# Patient Record
Sex: Female | Born: 1985 | Race: Black or African American | Hispanic: No | Smoking: Never smoker
Health system: Southern US, Community
[De-identification: ages and names within clinical notes are randomized; demographics above are authoritative.]

## PROBLEM LIST (undated history)

## (undated) DIAGNOSIS — F419 Anxiety disorder, unspecified: Secondary | ICD-10-CM

## (undated) DIAGNOSIS — D649 Anemia, unspecified: Secondary | ICD-10-CM

## (undated) HISTORY — DX: Anemia, unspecified: D64.9

---

## 2007-02-05 ENCOUNTER — Encounter: Admission: RE | Admit: 2007-02-05 | Discharge: 2007-02-05 | Payer: Self-pay | Admitting: *Deleted

## 2007-02-27 ENCOUNTER — Inpatient Hospital Stay (HOSPITAL_COMMUNITY): Admission: AD | Admit: 2007-02-27 | Discharge: 2007-02-27 | Payer: Self-pay | Admitting: Obstetrics & Gynecology

## 2007-03-10 ENCOUNTER — Encounter: Payer: Self-pay | Admitting: Obstetrics and Gynecology

## 2007-03-10 ENCOUNTER — Ambulatory Visit: Payer: Self-pay | Admitting: Obstetrics and Gynecology

## 2007-03-10 ENCOUNTER — Inpatient Hospital Stay (HOSPITAL_COMMUNITY): Admission: AD | Admit: 2007-03-10 | Discharge: 2007-03-13 | Payer: Self-pay | Admitting: Obstetrics and Gynecology

## 2007-04-08 ENCOUNTER — Emergency Department (HOSPITAL_COMMUNITY): Admission: EM | Admit: 2007-04-08 | Discharge: 2007-04-08 | Payer: Self-pay | Admitting: Emergency Medicine

## 2007-09-09 ENCOUNTER — Emergency Department (HOSPITAL_COMMUNITY): Admission: EM | Admit: 2007-09-09 | Discharge: 2007-09-10 | Payer: Self-pay | Admitting: Emergency Medicine

## 2007-09-12 ENCOUNTER — Emergency Department (HOSPITAL_COMMUNITY): Admission: EM | Admit: 2007-09-12 | Discharge: 2007-09-12 | Payer: Self-pay | Admitting: Emergency Medicine

## 2007-09-14 ENCOUNTER — Inpatient Hospital Stay (HOSPITAL_COMMUNITY): Admission: AD | Admit: 2007-09-14 | Discharge: 2007-09-14 | Payer: Self-pay | Admitting: Obstetrics & Gynecology

## 2008-01-21 ENCOUNTER — Emergency Department (HOSPITAL_COMMUNITY): Admission: EM | Admit: 2008-01-21 | Discharge: 2008-01-21 | Payer: Self-pay | Admitting: Family Medicine

## 2008-05-08 ENCOUNTER — Inpatient Hospital Stay (HOSPITAL_COMMUNITY): Admission: AD | Admit: 2008-05-08 | Discharge: 2008-05-08 | Payer: Self-pay | Admitting: Family Medicine

## 2008-07-21 ENCOUNTER — Ambulatory Visit (HOSPITAL_COMMUNITY): Admission: RE | Admit: 2008-07-21 | Discharge: 2008-07-21 | Payer: Self-pay | Admitting: *Deleted

## 2008-09-11 ENCOUNTER — Inpatient Hospital Stay (HOSPITAL_COMMUNITY): Admission: AD | Admit: 2008-09-11 | Discharge: 2008-09-12 | Payer: Self-pay | Admitting: Obstetrics & Gynecology

## 2008-10-19 ENCOUNTER — Inpatient Hospital Stay (HOSPITAL_COMMUNITY): Admission: AD | Admit: 2008-10-19 | Discharge: 2008-10-19 | Payer: Self-pay | Admitting: Family Medicine

## 2008-10-19 ENCOUNTER — Ambulatory Visit: Payer: Self-pay | Admitting: Obstetrics and Gynecology

## 2008-12-03 HISTORY — PX: TUBAL LIGATION: SHX77

## 2008-12-17 ENCOUNTER — Inpatient Hospital Stay (HOSPITAL_COMMUNITY): Admission: AD | Admit: 2008-12-17 | Discharge: 2008-12-19 | Payer: Self-pay | Admitting: Obstetrics & Gynecology

## 2008-12-17 ENCOUNTER — Ambulatory Visit: Payer: Self-pay | Admitting: Obstetrics & Gynecology

## 2008-12-17 ENCOUNTER — Inpatient Hospital Stay (HOSPITAL_COMMUNITY): Admission: AD | Admit: 2008-12-17 | Discharge: 2008-12-17 | Payer: Self-pay | Admitting: Obstetrics & Gynecology

## 2008-12-17 ENCOUNTER — Ambulatory Visit: Payer: Self-pay | Admitting: Advanced Practice Midwife

## 2010-04-23 ENCOUNTER — Emergency Department (HOSPITAL_COMMUNITY): Admission: EM | Admit: 2010-04-23 | Discharge: 2010-04-23 | Payer: Self-pay | Admitting: Emergency Medicine

## 2010-11-13 LAB — CBC
HCT: 31.8 % — ABNORMAL LOW (ref 36.0–46.0)
Hemoglobin: 11.1 g/dL — ABNORMAL LOW (ref 12.0–15.0)
Hemoglobin: 12.9 g/dL (ref 12.0–15.0)
MCV: 94.1 fL (ref 78.0–100.0)
Platelets: 244 10*3/uL (ref 150–400)
RBC: 3.38 MIL/uL — ABNORMAL LOW (ref 3.87–5.11)
RBC: 3.98 MIL/uL (ref 3.87–5.11)
RDW: 14.6 % (ref 11.5–15.5)
WBC: 12.7 10*3/uL — ABNORMAL HIGH (ref 4.0–10.5)

## 2010-11-15 LAB — URINALYSIS, ROUTINE W REFLEX MICROSCOPIC
Bilirubin Urine: NEGATIVE
Hgb urine dipstick: NEGATIVE
Ketones, ur: NEGATIVE mg/dL
Protein, ur: NEGATIVE mg/dL
Specific Gravity, Urine: 1.015 (ref 1.005–1.030)
Urobilinogen, UA: 0.2 mg/dL (ref 0.0–1.0)

## 2010-11-15 LAB — WET PREP, GENITAL
Clue Cells Wet Prep HPF POC: NONE SEEN
Yeast Wet Prep HPF POC: NONE SEEN

## 2010-11-15 LAB — GC/CHLAMYDIA PROBE AMP, GENITAL
Chlamydia, DNA Probe: NEGATIVE
GC Probe Amp, Genital: NEGATIVE

## 2010-11-15 LAB — FETAL FIBRONECTIN: Fetal Fibronectin: NEGATIVE

## 2010-11-15 LAB — PROTIME-INR: Prothrombin Time: 12.8 seconds (ref 11.6–15.2)

## 2010-11-15 LAB — CBC
Platelets: 248 10*3/uL (ref 150–400)
RBC: 3.64 MIL/uL — ABNORMAL LOW (ref 3.87–5.11)
RDW: 14.3 % (ref 11.5–15.5)

## 2010-11-15 LAB — FIBRINOGEN: Fibrinogen: 513 mg/dL — ABNORMAL HIGH (ref 204–475)

## 2010-12-18 NOTE — Op Note (Signed)
Carla Cox, Carla Cox              ACCOUNT NO.:  [id number]   MEDICAL RECORD NO.:  [id number]          PATIENT TYPE:  INP   LOCATION:  9122                          FACILITY:  [facility]   PHYSICIAN:  Allie Bossier, MD        DATE OF BIRTH:  Apr 04, 1986   DATE OF PROCEDURE:  DATE OF DISCHARGE:                               OPERATIVE REPORT   PREOPERATIVE DIAGNOSIS:  Multiparity, desires sterility.   POSTOPERATIVE DIAGNOSIS:  Multiparity, desires sterility.   PROCEDURE:  Application of Filshie clips, bilateral tubal sterilization  procedure.   SURGEON:  M C. Marice Potter, MD   ANESTHESIA:  Epidural, Octaviano Glow. Pamalee Leyden, M.D.   COMPLICATIONS:  None.   ESTIMATED BLOOD LOSS:  Minimal.   SPECIMENS:  None.   FINDINGS:  Normal-appearing adnexa.   DETAILS OF PROCEDURE AND FINDINGS:  The risks, benefits, alternatives,  and 0.05% failure rate were explained, understood, and accepted.  Consents were signed.  All questions were answered.  She declined  alternative forms of birth control.  Her epidural was bolused for  surgery.  Abdomen was prepped and draped in usual sterile fashion.  A  Foley catheter was placed, which drained clear urine throughout the  case.  Adequate anesthesia was assured and a transverse umbilical  incision was made.  The peritoneum was entered with hemostat.  Peritoneal incision was extended with Mayo scissors as was the fascial  incision.  Using an [employer] retractor, I was able to visualize the  right oviduct, it was grasped with a Babcock clamp and traced to its  fimbriated end that was then retraced to the isthmic region where a  Filshie clip was placed across the entire oviduct in a perpendicular  fashion.  Approximately 1 mL of 0.5% Marcaine was injected just distal  to the Filshie clip.  A repeat procedure was performed on the left side.  Again, the fimbria was visualized and the Filshie clip was placed in the  isthmic region.  1 mL of 0.5% Marcaine was injected just  distal to the  clip on the oviduct.  The tubes were allowed to fall back in the  abdominal cavity.  The fascia was elevated with Allis clamps and closed  with a 0 Vicryl running nonlocking suture.  Then, 80 mL of 0.5% Marcaine  were injected into the subcutaneous tissue at the umbilicus and at the  umbilical  incision, and the incision was closed with a 4-0 Vicryl running  nonlocking suture.  Excellent cosmetic effects were obtained.  She was  taken to recovery room in stable condition.  The instruments, sponge,  and needle counts were correct.      Allie Bossier, MD  Electronically Signed     MCD/MEDQ  D:  12/17/2008  T:  12/18/2008  Job:  [id number]

## 2010-12-18 NOTE — Op Note (Signed)
Carla Cox, Carla Cox               ACCOUNT NO.:  [id number]   MEDICAL RECORD NO.:  [id number]          PATIENT TYPE:  INP   LOCATION:  9107                          FACILITY:  [facility]   PHYSICIAN:  Phil D. Okey Dupre, M.D.     DATE OF BIRTH:  1986/04/24   DATE OF PROCEDURE:  03/10/2007  DATE OF DISCHARGE:                               OPERATIVE REPORT   PROCEDURE:  Low transverse cesarean section.   PREOPERATIVE DIAGNOSIS:  Severe bradycardia following artificial rupture  of membranes, pregnancy-induced hypertension.   POSTOPERATIVE DIAGNOSIS:  Severe bradycardia following artificial  rupture of membranes, pregnancy-induced hypertension.   SURGEON:  Javier Glazier. Okey Dupre, M.D.   FIRST ASSISTANT:  Dr. Alanda Amass.   ANESTHESIA:  General.   ESTIMATED BLOOD LOSS:  500 mL.   TISSUES TO PATHOLOGY:  Placenta.   POSTOPERATIVE CONDITION:  Satisfactory.    The patient a 25 year old African American female 40 weeks was brought  in early labor because of pregnancy-induced hypertension. She made no  progress after several hours here so artificial rupture of membranes was  undertaken.  A short time after that the patient had one decel which  returned to baseline with good variability after five minutes.  Shortly  thereafter there was a large decel down to 70s which persisted.  The  patient was taken to the operating room where general anesthesia was  administered and the patient delivered by cesarean section.   PROCEDURE WENT AS FOLLOWS:  Under satisfactory general anesthesia the  patient dorsal supine position with Foley catheter in urinary bladder,  the abdomen was prepped, draped in usual sterile manner and entered  through a vertical midline incision starting just below the umbilicus  and running down to just above the symphysis pubis.  The abdomen was  entered by layers entering the peritoneal cavity, visceral peritoneum  and the anterior surface of the uterus was opened transversely by sharp  dissection.  The bladder pushed away the lower uterine segment. It was  entered by sharp and blunt dissection and from a LOT presentation the  baby was easily delivered.  There was slight incision over the baby's  right eye noted.  Cord was doubly clamped, divided, baby handed to the  pediatrician.  Placenta spontaneously removed after specimen was taken  from the cord for analysis and the uterus closed with continuous running  locked 0-0 Vicryl on an atraumatic needle.  The area observed for  bleeding.  None was noted.  Pelvis was irrigated with normal saline and  the fascia was closed with PDS loop suture of 0-0 white running suture  and subcutaneous bleeders controlled with hot cautery and skin staples  used for skin edge approximation.  Dry sterile dressings applied and the  patient transferred recovery room in satisfactory condition having  tolerated the procedure well.      Phil D. Okey Dupre, M.D.  Electronically Signed     PDR/MEDQ  D:  03/10/2007  T:  03/11/2007  Job:  [id number]

## 2010-12-18 NOTE — Discharge Summary (Signed)
Carla Cox               ACCOUNT NO.:  [id number]   MEDICAL RECORD NO.:  [id number]          PATIENT TYPE:  INP   LOCATION:  9107                          FACILITY:  [facility]   PHYSICIAN:  Phil D. Okey Dupre, M.D.     DATE OF BIRTH:  [birth year]   DATE OF ADMISSION:  03/10/2007  DATE OF DISCHARGE:  03/13/2007                               DISCHARGE SUMMARY   ADMITTING DIAGNOSES:  1. Onset of labor.  2. Pregnancy-induced hypertension.   DISCHARGE DIAGNOSES:  1. Status post emergent low transverse cesarean section secondary to      nonreassuring fetal heart rate.  The patient is postoperative day      #3.  2. Pregnancy-induced hypertension.   SIGNIFICANT LABS:  Prenatal labs were significant for O positive blood  type.  Antibody test was negative.  RPR nonreactive.  Rubella titer was  equivocal.  Hepatitis B surface antigen was negative.  Group B strep was  negative.  HIV negative.  PIH labs performed on the date of admission  were within normal limits including a complete metabolic panel including  liver function tests, LDH, and uric acid.  Urinalysis performed on  admission was negative for protein.  CBC on admission showed a  hemoglobin of 12.4, white blood cell count 9.6, and a platelet count of  309.  Postop CBC on August 6 showed a hemoglobin of 10.8, white blood  cell count 12.8, platelet count 242.  Repeat CBC performed on August 7  showed a hemoglobin of 10.5, platelet count 242, and a white blood cell  count of 11.8.   HOSPITAL COURSE:  The patient was admitted with the onset of labor.  The  patient was allowed to labor to the afternoon of August 5.  After  rupture of membranes evening of August 5, the patient was noted to have  fetal bradycardia and was therefore sent for an emergent cesarean  section.  This was performed at approximately 2100 hours on August 5,  low transverse cesarean section with a vertical incision.  Estimated  blood loss from this procedure was  approximately 500 mL.  A viable female  was delivered via the C-section with placenta manually extracted from  the uterus and sent to pathology.  Postoperatively, the patient had no  complications and recovered well.  Postoperative hemoglobin was within  normal limits at 10.8 on postoperative day #1.  Postoperative day #2, it  was 10.4.  Throughout the rest of her recovery, the patient had  decreasing pain controlled by Percocet, decreased bleeding, and her  incision remained clean, dry, and intact throughout her hospital stay.  On March 13, 2007, postoperative day #3, the patient continued to be  stable and was allowed to be discharged home with her baby.  At the time  of discharge, the patient was breast-feeding, was ambulating without  difficulty with decreased lochia, positive flatus but no bowel movement.  The patient had good urine output.  Regarding her pregnancy-induced  hypertension, her blood pressures remained stable throughout her  hospital course and on the date of discharge were noted to be  125-140  systolic over 75-86 diastolic.  The patient was informed that a home  health [facility] nurse would remove her incision staples on postoperative  day 7-10.  Secondary to an equivocal rubella screen on admission to the  hospital, the patient received MMR vaccine prior to discharge from the  hospital.   DISCHARGE MEDICATIONS:  1. Motrin 600 mg p.o. q.6h. p.r.n. pain.  2. Percocet 5/325 mg 1-2 tabs q.4-6h. p.r.n. pain.  3. Colace 100 mg p.o. b.i.d. p.r.n. constipation.  4. Prenatal vitamins 1 p.o. daily.   DISCHARGE INSTRUCTIONS:  1. The patient is to take medications as mentioned previously.  2. Home health [facility] nurse will come out to the patient's house to      remove her incision staples on postoperative day 7-10.  3. The patient is not to undertake any heavy lifting x6 weeks.  4. The patient is to maintain pelvic rest x6 weeks.  5. The patient is to follow up at the  [facility]      Department in 6 weeks.  6. The patient is to return for any evidence of wound infection or      wound dehiscence.  7. The patient is also to return for any concerning symptoms such as      fever, chills, increased bleeding, lightheadedness, dizziness, or      symptoms of hypovolemia.   DISCHARGE CONDITION:  Stable.      Carla Soman, MD      Javier Glazier. Okey Dupre, M.D.  Electronically Signed    TE/MEDQ  D:  03/13/2007  T:  03/13/2007  Job:  [id number]

## 2011-04-26 LAB — URINALYSIS, ROUTINE W REFLEX MICROSCOPIC
Bilirubin Urine: NEGATIVE
Glucose, UA: NEGATIVE
Glucose, UA: NEGATIVE
Hgb urine dipstick: NEGATIVE
Hgb urine dipstick: NEGATIVE
Ketones, ur: NEGATIVE
Protein, ur: NEGATIVE
Specific Gravity, Urine: 1.028
Urobilinogen, UA: 0.2
pH: 8

## 2011-04-26 LAB — DIFFERENTIAL
Basophils Absolute: 0.1
Lymphs Abs: 1.8
Monocytes Absolute: 0.6
Monocytes Relative: 6
Neutrophils Relative %: 76

## 2011-04-26 LAB — I-STAT 8, (EC8 V) (CONVERTED LAB)
Acid-base deficit: 2
Acid-base deficit: 2
BUN: 4 — ABNORMAL LOW
BUN: 9
Bicarbonate: 23
Bicarbonate: 23.3
Chloride: 106
HCT: 39
HCT: 41
Hemoglobin: 13.3
Hemoglobin: 13.9
Operator id: 151321
Operator id: 294341
Sodium: 138
Sodium: 138
TCO2: 24
pCO2, Ven: 40.3 — ABNORMAL LOW
pCO2, Ven: 40.9 — ABNORMAL LOW

## 2011-04-26 LAB — WET PREP, GENITAL: Yeast Wet Prep HPF POC: NONE SEEN

## 2011-04-26 LAB — POCT I-STAT CREATININE
Creatinine, Ser: 0.6
Creatinine, Ser: 0.7
Operator id: 151321

## 2011-04-26 LAB — CBC
Hemoglobin: 12.3
MCV: 88.5
Platelets: 338

## 2011-04-26 LAB — HEPATIC FUNCTION PANEL
AST: 24
Albumin: 4
Alkaline Phosphatase: 93
Bilirubin, Direct: 0.2
Total Bilirubin: 0.5

## 2011-04-26 LAB — URINE MICROSCOPIC-ADD ON

## 2011-04-26 LAB — OCCULT BLOOD X 1 CARD TO LAB, STOOL: Fecal Occult Bld: NEGATIVE

## 2011-05-02 LAB — POCT URINALYSIS DIP (DEVICE)
Bilirubin Urine: NEGATIVE
Ketones, ur: NEGATIVE
Protein, ur: NEGATIVE
Specific Gravity, Urine: 1.015
pH: 8

## 2011-05-02 LAB — POCT PREGNANCY, URINE: Operator id: 239701

## 2011-05-02 LAB — WET PREP, GENITAL
Trich, Wet Prep: NONE SEEN
Yeast Wet Prep HPF POC: NONE SEEN

## 2011-05-02 LAB — GC/CHLAMYDIA PROBE AMP, GENITAL: GC Probe Amp, Genital: NEGATIVE

## 2011-05-06 LAB — CBC
Platelets: 319
RBC: 4.15
WBC: 9.3

## 2011-05-06 LAB — COMPREHENSIVE METABOLIC PANEL
ALT: 14
AST: 18
Albumin: 3.9
Alkaline Phosphatase: 79
Chloride: 103
GFR calc Af Amer: >60
Potassium: 4
Sodium: 135
Total Bilirubin: 0.2 — ABNORMAL LOW

## 2011-05-06 LAB — URINALYSIS, ROUTINE W REFLEX MICROSCOPIC
Glucose, UA: NEGATIVE
Hgb urine dipstick: NEGATIVE
Ketones, ur: NEGATIVE
Protein, ur: NEGATIVE
Urobilinogen, UA: 0.2

## 2011-05-06 LAB — WET PREP, GENITAL
Clue Cells Wet Prep HPF POC: NONE SEEN
Trich, Wet Prep: NONE SEEN

## 2011-05-06 LAB — POCT PREGNANCY, URINE: Preg Test, Ur: POSITIVE

## 2011-05-17 LAB — I-STAT 8, (EC8 V) (CONVERTED LAB)
BUN: 3 — ABNORMAL LOW
Chloride: 106
Glucose, Bld: 82
Hemoglobin: 11.9 — ABNORMAL LOW
Operator id: 239701
pCO2, Ven: 43.7 — ABNORMAL LOW
pH, Ven: 7.368 — ABNORMAL HIGH

## 2011-05-17 LAB — POCT I-STAT CREATININE: Creatinine, Ser: 0.6

## 2011-05-20 LAB — CBC
HCT: 31.3 — ABNORMAL LOW
HCT: 31.5 — ABNORMAL LOW
HCT: 36.8
Hemoglobin: 10.5 — ABNORMAL LOW
Hemoglobin: 10.8 — ABNORMAL LOW
MCHC: 33.6
Platelets: 309
RBC: 3.52 — ABNORMAL LOW
RDW: 15.3 — ABNORMAL HIGH
WBC: 12.8 — ABNORMAL HIGH
WBC: 9.6

## 2011-05-20 LAB — URINALYSIS, DIPSTICK ONLY
Glucose, UA: NEGATIVE
Ketones, ur: NEGATIVE
Specific Gravity, Urine: 1.01
pH: 6.5

## 2011-05-20 LAB — DIFFERENTIAL
Basophils Absolute: 0
Basophils Relative: 0
Eosinophils Relative: 0
Monocytes Absolute: 0.8 — ABNORMAL HIGH
Monocytes Relative: 7

## 2011-05-20 LAB — URINALYSIS, ROUTINE W REFLEX MICROSCOPIC
Bilirubin Urine: NEGATIVE
Hgb urine dipstick: NEGATIVE
Ketones, ur: NEGATIVE
Nitrite: NEGATIVE
Urobilinogen, UA: 0.2
pH: 6

## 2011-05-20 LAB — URIC ACID: Uric Acid, Serum: 4

## 2011-05-20 LAB — COMPREHENSIVE METABOLIC PANEL
AST: 17
Albumin: 2.8 — ABNORMAL LOW
Alkaline Phosphatase: 208 — ABNORMAL HIGH
BUN: <1 — ABNORMAL LOW
Chloride: 108
Potassium: 4
Total Bilirubin: 0.5

## 2011-05-20 LAB — RPR: RPR Ser Ql: NONREACTIVE

## 2011-08-06 ENCOUNTER — Encounter: Payer: Self-pay | Admitting: *Deleted

## 2011-08-06 ENCOUNTER — Other Ambulatory Visit: Payer: Self-pay

## 2011-08-06 ENCOUNTER — Emergency Department (HOSPITAL_COMMUNITY): Payer: Self-pay

## 2011-08-06 ENCOUNTER — Emergency Department (HOSPITAL_COMMUNITY)
Admission: EM | Admit: 2011-08-06 | Discharge: 2011-08-06 | Disposition: A | Discharge status: Home / Self Care | Payer: Self-pay | Attending: Emergency Medicine | Admitting: Emergency Medicine

## 2011-08-06 DIAGNOSIS — R079 Chest pain, unspecified: Secondary | ICD-10-CM | POA: Insufficient documentation

## 2011-08-06 LAB — D-DIMER, QUANTITATIVE: D-Dimer, Quant: 0.79 ug/mL-FEU — ABNORMAL HIGH (ref 0.00–0.48)

## 2011-08-06 LAB — POCT I-STAT, CHEM 8
BUN: 5 mg/dL — ABNORMAL LOW (ref 6–23)
Calcium, Ion: 1.21 mmol/L (ref 1.12–1.32)
Chloride: 105 mEq/L (ref 96–112)
HCT: 36 % (ref 36.0–46.0)
Potassium: 3.7 mEq/L (ref 3.5–5.1)

## 2011-08-06 LAB — CBC
HCT: 33.6 % — ABNORMAL LOW (ref 36.0–46.0)
Hemoglobin: 11.1 g/dL — ABNORMAL LOW (ref 12.0–15.0)
MCV: 89.1 fL (ref 78.0–100.0)
RDW: 14.5 % (ref 11.5–15.5)
WBC: 8.5 10*3/uL (ref 4.0–10.5)

## 2011-08-06 MED ORDER — SODIUM CHLORIDE 0.9 % IV BOLUS (SEPSIS)
1000.0000 mL | Freq: Once | INTRAVENOUS | Status: AC
  Administered 2011-08-06: 1000 mL via INTRAVENOUS

## 2011-08-06 MED ORDER — IOHEXOL 350 MG/ML SOLN
80.0000 mL | Freq: Once | INTRAVENOUS | Status: AC | PRN
  Administered 2011-08-06: 80 mL via INTRAVENOUS

## 2011-08-06 MED ORDER — IBUPROFEN 800 MG PO TABS: 800.0000 mg | ORAL_TABLET | Freq: Three times a day (TID) | ORAL | Status: AC

## 2011-08-06 NOTE — ED Notes (Signed)
Received pt through change of shift report. Pt taken to CT.

## 2011-08-06 NOTE — ED Notes (Signed)
Pt notified of elevated D-dimer results and need for CT.

## 2011-08-06 NOTE — ED Notes (Signed)
Pt to ED c/o acute onset sternal chest pain.  Pain increases with inspiration and movement.  No cardiac hx or hx of blood clots.  Lung sounds clear bil.

## 2011-08-06 NOTE — ED Provider Notes (Signed)
History     CSN: [id number]  Arrival date & time 08/06/11  0335   First MD Initiated Contact with Patient 08/06/11 [phone]      Chief Complaint  Patient presents with  . Chest Pain    (Consider location/radiation/quality/duration/timing/severity/associated sxs/prior treatment) Patient is a 26 y.o. female presenting with chest pain. The history is provided by the patient.  Chest Pain The chest pain began 1 - 2 hours ago. Duration of episode(s) is 2 hours. Chest pain occurs constantly. The chest pain is unchanged. The pain is associated with breathing. The severity of the pain is moderate. The quality of the pain is described as sharp. The pain does not radiate. Chest pain is worsened by certain positions. Pertinent negatives for primary symptoms include no fever, no syncope, no shortness of breath, no cough, no wheezing, no palpitations, no abdominal pain, no nausea, no vomiting and no altered mental status. She tried nothing for the symptoms. Risk factors include no known risk factors.    left-sided anterior chest wall pain. Woke her up from sleep. It hurts to take a deep breath and hurts to move. No recent illness. Is not a smoker. No known sick contacts. No history of same. No leg pain or swelling. No recent surgery. No recent travel. No history of DVT or PE.  History reviewed. No pertinent past medical history.  Past Surgical History  Procedure Date  . Other surgical history 2008    c-section    No family history on file.  History  Substance Use Topics  . Smoking status: Never Smoker   . Smokeless tobacco: Not on file  . Alcohol Use: No    OB History    Grav Para Term Preterm Abortions TAB SAB Ect Mult Living                  Review of Systems  Constitutional: Negative for fever and chills.  HENT: Negative for neck pain and neck stiffness.   Eyes: Negative for pain.  Respiratory: Negative for cough, shortness of breath and wheezing.   Cardiovascular: Positive for chest  pain. Negative for palpitations and syncope.  Gastrointestinal: Negative for nausea, vomiting and abdominal pain.  Genitourinary: Negative for dysuria.  Musculoskeletal: Negative for back pain.  Skin: Negative for rash.  Neurological: Negative for headaches.  Psychiatric/Behavioral: Negative for altered mental status.  All other systems reviewed and are negative.    Allergies  Review of patient's allergies indicates no known allergies.  Home Medications  No current outpatient prescriptions on file.  BP 130/77  Pulse 86  Temp(Src) 98.1 F (36.7 C) (Oral)  Resp 18  SpO2 100%  LMP 08/06/2011  Physical Exam  Constitutional: She is oriented to person, place, and time. She appears well-developed and well-nourished.  HENT:  Head: Normocephalic and atraumatic.  Eyes: Conjunctivae and EOM are normal. Pupils are equal, round, and reactive to light.  Neck: Trachea normal. Neck supple. No thyromegaly present.  Cardiovascular: Normal rate, regular rhythm, S1 normal, S2 normal and normal pulses.     No systolic murmur is present   No diastolic murmur is present  Pulses:      Radial pulses are 2+ on the right side, and 2+ on the left side.  Pulmonary/Chest: Effort normal and breath sounds normal. She has no wheezes. She has no rhonchi. She has no rales. She exhibits no tenderness.  Abdominal: Soft. Normal appearance and bowel sounds are normal. There is no tenderness. There is no CVA tenderness and  negative Murphy's sign.  Musculoskeletal:       BLE:s Calves nontender, no cords or erythema, negative Homans sign  Neurological: She is alert and oriented to person, place, and time. She has normal strength. No cranial nerve deficit or sensory deficit. GCS eye subscore is 4. GCS verbal subscore is 5. GCS motor subscore is 6.  Skin: Skin is warm and dry. No rash noted. She is not diaphoretic.  Psychiatric: Her speech is normal.       Cooperative and appropriate    ED Course  Procedures  (including critical care time)  Results for orders placed during the hospital encounter of 08/06/11  D-DIMER, QUANTITATIVE      Component Value Range   D-Dimer, Quant 0.79 (*) 0.00 - 0.48 (ug/mL-FEU)  POCT I-STAT, CHEM 8      Component Value Range   Sodium 143  135 - 145 (mEq/L)   Potassium 3.7  3.5 - 5.1 (mEq/L)   Chloride 105  96 - 112 (mEq/L)   BUN 5 (*) 6 - 23 (mg/dL)   Creatinine, Ser 4.09  0.50 - 1.10 (mg/dL)   Glucose, Bld 88  70 - 99 (mg/dL)   Calcium, Ion 8.11  9.14 - 1.32 (mmol/L)   TCO2 26  0 - 100 (mmol/L)   Hemoglobin 12.2  12.0 - 15.0 (g/dL)   HCT 78.2  95.6 - 21.3 (%)  CBC      Component Value Range   WBC 8.5  4.0 - 10.5 (K/uL)   RBC 3.77 (*) 3.87 - 5.11 (MIL/uL)   Hemoglobin 11.1 (*) 12.0 - 15.0 (g/dL)   HCT 08.6 (*) 57.8 - 46.0 (%)   MCV 89.1  78.0 - 100.0 (fL)   MCH 29.4  26.0 - 34.0 (pg)   MCHC 33.0  30.0 - 36.0 (g/dL)   RDW 46.9  62.9 - 52.8 (%)   Platelets 324  150 - 400 (K/uL)   Dg Chest 2 View  08/06/2011  *RADIOLOGY REPORT*  Clinical Data: Left-sided chest pain on inspiration.  CHEST - 2 VIEW  Comparison: None.  Findings: Slightly shallow inspiration. The heart size and pulmonary vascularity are normal. The lungs appear clear and expanded without focal air space disease or consolidation. No blunting of the costophrenic angles.  No pneumothorax.  IMPRESSION: No evidence of active pulmonary disease.  Original Report Authenticated By: Marlon Pel, M.D.   Ct Angio Chest W/cm &/or Wo Cm  08/06/2011  *RADIOLOGY REPORT*  Clinical Data:  Left-sided chest pain  CT ANGIOGRAPHY CHEST WITH CONTRAST  Technique:  Multidetector CT imaging of the chest was performed using the standard protocol during bolus administration of intravenous contrast.  Multiplanar CT image reconstructions including MIPs were obtained to evaluate the vascular anatomy.  Contrast: 80mL OMNIPAQUE IOHEXOL 350 MG/ML IV SOLN  Comparison:  Chest radiograph 08/06/2011  Findings:  This is a technically  good examination of the pulmonary arterial tree.  No focal filling defects are identified in the pulmonary arteries.  The thoracic aorta is normal in caliber. There is no significant contrast within the thoracic aorta at the time imaging.  Heart size is normal.  Negative for pleural or pericardial effusion.  Negative for lymphadenopathy. Thyroid gland normal.  The trachea and mainstem bronchi are clear.  Linear atelectasis in the right middle lobe.  Otherwise, the lungs are well expanded and clear.  There is no airspace disease or significant atelectasis. Negative for pneumothorax.  The bony thorax is intact. The no acute or suspicious bony  abnormality.  Imaged portion of the upper abdomen is within normal limits.  Review of the MIP images confirms the above findings.  IMPRESSION:  1.  Negative for pulmonary embolism. 2.  No acute findings in the chest.  Original Report Authenticated By: Britta Mccreedy, M.D.      Date: 08/06/2011  Rate: 78  Rhythm: normal sinus rhythm  QRS Axis: normal  Intervals: normal  ST/T Wave abnormalities: nonspecific ST changes  Conduction Disutrbances:none  Narrative Interpretation:   Old EKG Reviewed: none available     MDM   Left-sided chest pain, pleuritic in nature. Screening EKG as above. Chest x-ray obtained and reviewed. For elevated d-dimer a CT PE study was obtained. No clinical DVT. He and CT scan are reviewed no PE. Patient declines any pain medications. Plan treatment as an outpatient for musculoskeletal chest pain. Primary care followup as needed.        Sunnie Nielsen, MD 08/06/11 [phone]

## 2011-08-06 NOTE — ED Notes (Signed)
Pt offered pain medication for pain 3/10, but she did not want any pain meds at this time.

## 2011-08-06 NOTE — ED Notes (Signed)
Patient transported to X-ray 

## 2011-08-06 NOTE — ED Notes (Signed)
Patient transported to CT 

## 2011-08-06 NOTE — ED Notes (Signed)
Pt is here with left chest pain that hurts with deep breath or sudden movement that started 3 hours ago.  No history

## 2011-08-18 ENCOUNTER — Other Ambulatory Visit: Payer: Self-pay

## 2011-08-18 ENCOUNTER — Encounter (HOSPITAL_COMMUNITY): Payer: Self-pay | Admitting: *Deleted

## 2011-08-18 ENCOUNTER — Emergency Department (HOSPITAL_COMMUNITY)
Admission: EM | Admit: 2011-08-18 | Discharge: 2011-08-19 | Disposition: A | Discharge status: Home / Self Care | Payer: Self-pay | Attending: Emergency Medicine | Admitting: Emergency Medicine

## 2011-08-18 DIAGNOSIS — R0602 Shortness of breath: Secondary | ICD-10-CM | POA: Insufficient documentation

## 2011-08-18 DIAGNOSIS — R072 Precordial pain: Secondary | ICD-10-CM | POA: Insufficient documentation

## 2011-08-18 DIAGNOSIS — F419 Anxiety disorder, unspecified: Secondary | ICD-10-CM

## 2011-08-18 DIAGNOSIS — F411 Generalized anxiety disorder: Secondary | ICD-10-CM | POA: Insufficient documentation

## 2011-08-18 NOTE — ED Notes (Signed)
Patient states has been having substernal chest pain "all day" was laying in the bed when pain started, says hurts to take a deep breath not tender to palpation or movement. Rates pain 7/10

## 2011-08-18 NOTE — ED Notes (Signed)
The pt is c/o mid-chest pain since yesterday.  She was here 2 weeks ago for the same and was told it was muscle sapsms

## 2011-08-19 ENCOUNTER — Emergency Department (HOSPITAL_COMMUNITY)
Admission: EM | Admit: 2011-08-19 | Discharge: 2011-08-19 | Disposition: A | Discharge status: Home / Self Care | Payer: No Typology Code available for payment source | Attending: Emergency Medicine | Admitting: Emergency Medicine

## 2011-08-19 ENCOUNTER — Encounter (HOSPITAL_COMMUNITY): Payer: Self-pay | Admitting: Family Medicine

## 2011-08-19 ENCOUNTER — Emergency Department (HOSPITAL_COMMUNITY): Payer: No Typology Code available for payment source

## 2011-08-19 DIAGNOSIS — T1490XA Injury, unspecified, initial encounter: Secondary | ICD-10-CM | POA: Insufficient documentation

## 2011-08-19 DIAGNOSIS — R0789 Other chest pain: Secondary | ICD-10-CM | POA: Insufficient documentation

## 2011-08-19 DIAGNOSIS — F411 Generalized anxiety disorder: Secondary | ICD-10-CM | POA: Insufficient documentation

## 2011-08-19 DIAGNOSIS — R079 Chest pain, unspecified: Secondary | ICD-10-CM | POA: Insufficient documentation

## 2011-08-19 HISTORY — DX: Anxiety disorder, unspecified: F41.9

## 2011-08-19 MED ORDER — ALPRAZOLAM 0.5 MG PO TABS: 0.2500 mg | ORAL_TABLET | Freq: Every evening | ORAL | Status: AC | PRN

## 2011-08-19 MED ORDER — KETOROLAC TROMETHAMINE 30 MG/ML IJ SOLN
30.0000 mg | Freq: Once | INTRAMUSCULAR | Status: AC
  Administered 2011-08-19: 30 mg via INTRAMUSCULAR
  Filled 2011-08-19: qty 1

## 2011-08-19 MED ORDER — LORAZEPAM 0.5 MG PO TABS
0.5000 mg | ORAL_TABLET | Freq: Once | ORAL | Status: AC
  Administered 2011-08-19: 0.5 mg via ORAL
  Filled 2011-08-19: qty 1

## 2011-08-19 MED ORDER — DIAZEPAM 5 MG PO TABS: 5.0000 mg | ORAL_TABLET | Freq: Two times a day (BID) | ORAL | Status: AC

## 2011-08-19 MED ORDER — IBUPROFEN 800 MG PO TABS: 800.0000 mg | ORAL_TABLET | Freq: Three times a day (TID) | ORAL | Status: AC
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
## 2011-08-19 NOTE — Discharge Instructions (Signed)
You were seen and evaluated today for your symptoms of continued chest pains. At this time your providers do not feel your symptoms are caused from any emergent condition. Your providers recommend that you continue Tylenol and ibuprofen to treat your pain. You have also been given a new prescription for Xanax to try to treat possible anxiety symptoms. Please take this as instructed and as needed for your symptoms. It is recommended that he followup with a primary care provider for continued evaluation and treatment of your symptoms.  Anxiety and Panic Attacks Your caregiver has informed you that you are having an anxiety or panic attack. There may be many forms of this. Most of the time these attacks come suddenly and without warning. They come at any time of day, including periods of sleep, and at any time of life. They may be strong and unexplained. Although panic attacks are very scary, they are physically harmless. Sometimes the cause of your anxiety is not known. Anxiety is a protective mechanism of the body in its fight or flight mechanism. Most of these perceived danger situations are actually nonphysical situations (such as anxiety over losing a job). CAUSES  The causes of an anxiety or panic attack are many. Panic attacks may occur in otherwise healthy people given a certain set of circumstances. There may be a genetic cause for panic attacks. Some medications may also have anxiety as a side effect. SYMPTOMS  Some of the most common feelings are:  Intense terror.   Dizziness, feeling faint.   Hot and cold flashes.   Fear of going crazy.   Feelings that nothing is real.   Sweating.   Shaking.   Chest pain or a fast heartbeat (palpitations).   Smothering, choking sensations.   Feelings of impending doom and that death is near.   Tingling of extremities, this may be from over-breathing.   Altered reality (derealization).   Being detached from yourself (depersonalization).    Several symptoms can be present to make up anxiety or panic attacks. DIAGNOSIS  The evaluation by your caregiver will depend on the type of symptoms you are experiencing. The diagnosis of anxiety or panic attack is made when no physical illness can be determined to be a cause of the symptoms. TREATMENT  Treatment to prevent anxiety and panic attacks may include:  Avoidance of circumstances that cause anxiety.   Reassurance and relaxation.   Regular exercise.   Relaxation therapies, such as yoga.   Psychotherapy with a psychiatrist or therapist.   Avoidance of caffeine, alcohol and illegal drugs.   Prescribed medication.  SEEK IMMEDIATE MEDICAL CARE IF:   You experience panic attack symptoms that are different than your usual symptoms.   You have any worsening or concerning symptoms.  Document Released: 07/22/2005 Document Revised: 04/03/2011 Document Reviewed: 11/23/2009 [facility] Patient Information 2012 [place], [place].  Chest Pain (Nonspecific) It is often hard to give a specific diagnosis for the cause of chest pain. There is always a chance that your pain could be related to something serious, such as a heart attack or a blood clot in the lungs. You need to follow up with your caregiver for further evaluation. CAUSES   Heartburn.   Pneumonia or bronchitis.   Anxiety and stress.   Inflammation around your heart (pericarditis) or lung (pleuritis or pleurisy).   A blood clot in the lung.   A collapsed lung (pneumothorax). It can develop suddenly on its own (spontaneous pneumothorax) or from injury (trauma) to the chest.  The chest wall is composed of bones, muscles, and cartilage. Any of these can be the source of the pain.  The bones can be bruised by injury.   The muscles or cartilage can be strained by coughing or overwork.   The cartilage can be affected by inflammation and become sore (costochondritis).  DIAGNOSIS  Lab tests or other studies, such as X-rays,  an EKG, stress testing, or cardiac imaging, may be needed to find the cause of your pain.  TREATMENT   Treatment depends on what may be causing your chest pain. Treatment may include:   Acid blockers for heartburn.   Anti-inflammatory medicine.   Pain medicine for inflammatory conditions.   Antibiotics if an infection is present.   You may be advised to change lifestyle habits. This includes stopping smoking and avoiding caffeine and chocolate.   You may be advised to keep your head raised (elevated) when sleeping. This reduces the chance of acid going backward from your stomach into your esophagus.   Most of the time, nonspecific chest pain will improve within 2 to 3 days with rest and mild pain medicine.  HOME CARE INSTRUCTIONS   If antibiotics were prescribed, take the full amount even if you start to feel better.   For the next few days, avoid physical activities that bring on chest pain. Continue physical activities as directed.   Do not smoke cigarettes or drink alcohol until your symptoms are gone.   Only take over-the-counter or prescription medicine for pain, discomfort, or fever as directed by your caregiver.   Follow your caregiver's suggestions for further testing if your chest pain does not go away.   Keep any follow-up appointments you made. If you do not go to an appointment, you could develop lasting (chronic) problems with pain. If there is any problem keeping an appointment, you must call to reschedule.  SEEK MEDICAL CARE IF:   You think you are having problems from the medicine you are taking. Read your medicine instructions carefully.   Your chest pain does not go away, even after treatment.   You develop a rash with blisters on your chest.  SEEK IMMEDIATE MEDICAL CARE IF:   You have increased chest pain or pain that spreads to your arm, neck, jaw, back, or belly (abdomen).   You develop shortness of breath, an increasing cough, or you are coughing up  blood.   You have severe back or abdominal pain, feel sick to your stomach (nauseous) or throw up (vomit).   You develop severe weakness, fainting, or chills.   You have an oral temperature above 102 F (38.9 C), not controlled by medicine.  THIS IS AN EMERGENCY. Do not wait to see if the pain will go away. Get medical help at once. Call your local emergency services (911 in [place] Do not drive yourself to the hospital. MAKE SURE YOU:   Understand these instructions.   Will watch your condition.   Will get help right away if you are not doing well or get worse.  Document Released: 05/01/2005 Document Revised: 04/03/2011 Document Reviewed: 02/25/2008 [facility] Patient Information 2012 [place], [place].   RESOURCE GUIDE  Dental Problems  Patients with Medicaid: [facility] [phone] [address]  [address] Phone:  [phone]                                                  Phone:  [phone]  If unable to pay or uninsured, contact:  [facility] or [facility]. to become qualified for the adult dental clinic.  Chronic Pain Problems Contact [facility]  [phone] Patients need to be referred by their primary care doctor.  Insufficient Money for Medicine Contact [employer]:  call "211" or [facility] 7256534951.  No Primary Care Doctor Call [facility]  [phone] Other agencies that provide inexpensive medical care    [facility]  [phone]    [facility]  [phone]    [facility]  [phone]    [facility]  [phone]    [facility]  [phone]    [facility]  [phone]  Psychological Services [facility]  [phone] [facility]  [phone] [facility]   [phone] (emergency services [phone])  Substance Abuse Resources Alcohol and Drug  Services  [phone] [facility] ([phone] [facility] [phone] Floydene Flock [phone] Residential & Outpatient Substance Abuse Program  [phone]  Abuse/Neglect [facility] Child Abuse Hotline [phone] [facility] Child Abuse Hotline 430-803-1154 (After Hours)  Emergency Shelter [facility] [phone]  M[facility] [facility] [phone] [facility] [phone]  MRSA Hotline #:   2347134589    [facility]  [facility]     [employer]                          [facility] [address]                        [address]      [address]  Blondell Reveal Phone:  [phone]                                   Phone:  [phone]                 Phone:  [phone]  [facility] Mental [facility] Phone:  [phone]  [facility] Child Abuse Hotline 313-501-2144 [phone] (After Hours)

## 2011-08-19 NOTE — ED Provider Notes (Signed)
Medical screening examination/treatment/procedure(s) were performed by non-physician practitioner and as supervising physician I was immediately available for consultation/collaboration.  Shanetta Nicolls L Wilmar Prabhakar, MD 08/19/11 0724 

## 2011-08-19 NOTE — ED Provider Notes (Signed)
History     CSN: [id number]  Arrival date & time 08/18/11  2231   First MD Initiated Contact with Patient 08/18/11 2328      Chief Complaint  Patient presents with  . Chest Pain     HPI  History provided by the patient. Patient is 26 year old female with no significant past medical history who presents with complaints of sternal and left chest pains that have been persistent for the past several weeks to month. Patient reports being evaluated for similar symptoms 2 weeks ago. She states symptoms have not changed since that time. She reports a tightness and sharp pain to her chest that is worse with deep breathing, coughing and laughing. Patient also reports that she tends to feel anxious during onset of symptoms. She denies any previous history of anxiety or panic attacks. She states she does not feel like she hyperventilates during these attacks. She does report having some sensation of shortness of breath. She denies having a persistent cough, fever, chills, sweats. She denies any episodes of nausea vomiting or diarrhea.    History reviewed. No pertinent past medical history.  Past Surgical History  Procedure Date  . Other surgical history 2008    c-section    History reviewed. No pertinent family history.  History  Substance Use Topics  . Smoking status: Never Smoker   . Smokeless tobacco: Not on file  . Alcohol Use: No    OB History    Grav Para Term Preterm Abortions TAB SAB Ect Mult Living                  Review of Systems  Respiratory: Positive for chest tightness and shortness of breath.   Cardiovascular: Positive for chest pain.  Psychiatric/Behavioral: Negative for suicidal ideas, hallucinations and self-injury. The patient is nervous/anxious.   All other systems reviewed and are negative.    Allergies  Review of patient's allergies indicates no known allergies.  Home Medications   Current Outpatient Rx  Name Route Sig Dispense Refill  . IBUPROFEN  200 MG PO TABS Oral Take 400 mg by mouth every 6 (six) hours as needed. For pain      BP 125/71  Pulse 91  Temp(Src) 98.1 F (36.7 C) (Oral)  Resp 18  SpO2 100%  LMP 08/06/2011  Physical Exam  Nursing note and vitals reviewed. Constitutional: She is oriented to person, place, and time. She appears well-developed and well-nourished. No distress.  HENT:  Head: Normocephalic and atraumatic.  Mouth/Throat: Oropharynx is clear and moist.  Neck: Normal range of motion. Neck supple.  Cardiovascular: Normal rate and regular rhythm.   No murmur heard. Pulmonary/Chest: Effort normal and breath sounds normal. No stridor. No respiratory distress. She has no wheezes. She has no rales. She exhibits tenderness.  Abdominal: Soft. She exhibits no distension. There is no tenderness.  Musculoskeletal: She exhibits no edema and no tenderness.  Neurological: She is alert and oriented to person, place, and time.  Skin: Skin is warm and dry. No rash noted.  Psychiatric: She has a normal mood and affect. Her behavior is normal.    ED Course  Procedures      1. Chest pain   2. Anxiety       MDM  12:20 AM patient seen and evaluated. Patient in no acute distress. Pt is PERC negative.  Patient with negative workup 2 weeks ago including CT angiogram of chest with no signs for PE. Patient with normal respirations perfect O2 sats  today. Pulse also normal.      Angus Seller, [place] 08/19/11 [phone]
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
## 2011-08-21 NOTE — ED Provider Notes (Signed)
Medical screening examination/treatment/procedure(s) were performed by non-physician practitioner and as supervising physician I was immediately available for consultation/collaboration.  Berk Pilot T Siani Utke, MD 08/21/11 1657 

## 2012-08-19 ENCOUNTER — Encounter (HOSPITAL_COMMUNITY): Payer: Self-pay | Admitting: *Deleted

## 2012-08-19 ENCOUNTER — Emergency Department (HOSPITAL_COMMUNITY)
Admission: EM | Admit: 2012-08-19 | Discharge: 2012-08-19 | Disposition: A | Discharge status: Home / Self Care | Payer: BC Managed Care – PPO | Source: Home / Self Care | Attending: Family Medicine | Admitting: Family Medicine

## 2012-08-19 DIAGNOSIS — K2970 Gastritis, unspecified, without bleeding: Secondary | ICD-10-CM

## 2012-08-19 LAB — POCT URINALYSIS DIP (DEVICE)
Ketones, ur: NEGATIVE mg/dL
Protein, ur: NEGATIVE mg/dL
Specific Gravity, Urine: 1.025 (ref 1.005–1.030)
Urobilinogen, UA: 0.2 mg/dL (ref 0.0–1.0)
pH: 6 (ref 5.0–8.0)

## 2012-08-19 LAB — POCT PREGNANCY, URINE: Preg Test, Ur: NEGATIVE

## 2012-08-19 MED ORDER — ONDANSETRON 4 MG PO TBDP: 4.0000 mg | ORAL_TABLET | Freq: Three times a day (TID) | ORAL | Status: DC | PRN

## 2012-08-19 MED ORDER — OMEPRAZOLE 20 MG PO CPDR: 20.0000 mg | DELAYED_RELEASE_CAPSULE | Freq: Every day | ORAL | Status: DC

## 2012-08-19 NOTE — ED Provider Notes (Signed)
History     CSN: [id number]  Arrival date & time 08/19/12  1009   First MD Initiated Contact with Patient 08/19/12 1015      Chief Complaint  Patient presents with  . Nausea  . Emesis    (Consider location/radiation/quality/duration/timing/severity/associated sxs/prior treatment) HPI Comments: 27 year old female with history of anxiety otherwise healthy. Here complaining of nausea and emesis since last evening. Reports several episodes of dry heaving and at least 3 emesis (clear fluid with no blood mucus) since yesterday last time at 6 AM this morning after breakfast (food content). Has been able to tolerate some fluids afterwards. Symptoms associated with mild epigastric discomfort but denies acid taste in her mouth or history of acid reflux. No fever or chills. No dysuria or hematuria. No abdominal pain or diarrhea. No dizziness.   Past Medical History  Diagnosis Date  . Anxiety     Past Surgical History  Procedure Date  . Other surgical history 2008    c-section  . Tubal ligation     Family History  Problem Relation Age of Onset  . Hypertension Mother   . Diabetes Father   . Hypertension Other   . Diabetes Other     History  Substance Use Topics  . Smoking status: Never Smoker   . Smokeless tobacco: Not on file  . Alcohol Use: No    OB History    Grav Para Term Preterm Abortions TAB SAB Ect Mult Living                  Review of Systems  Constitutional: Negative for fever, chills, activity change and appetite change.  HENT: Negative for congestion.   Respiratory: Negative for cough.   Cardiovascular: Negative for chest pain and palpitations.  Gastrointestinal: Positive for nausea and vomiting. Negative for abdominal pain, diarrhea and constipation.  Genitourinary: Negative for dysuria, urgency, frequency, hematuria, flank pain, vaginal discharge and pelvic pain.  Musculoskeletal: Negative for arthralgias.  Neurological: Negative for dizziness and  headaches.    Allergies  Review of patient's allergies indicates no known allergies.  Home Medications   Current Outpatient Rx  Name  Route  Sig  Dispense  Refill  . IBUPROFEN 200 MG PO TABS   Oral   Take 400 mg by mouth every 6 (six) hours as needed. For pain         . OMEPRAZOLE 20 MG PO CPDR   Oral   Take 1 capsule (20 mg total) by mouth daily.   30 capsule   0   . ONDANSETRON 4 MG PO TBDP   Oral   Take 1 tablet (4 mg total) by mouth every 8 (eight) hours as needed for nausea.   10 tablet   0     BP 135/78  Pulse 80  Temp 98.9 F (37.2 C) (Oral)  Resp 16  SpO2 100%  LMP 07/30/2012  Physical Exam  Nursing note and vitals reviewed. Constitutional: She is oriented to person, place, and time. She appears well-developed and well-nourished. No distress.  HENT:  Head: Normocephalic and atraumatic.  Mouth/Throat: Oropharynx is clear and moist. No oropharyngeal exudate.  Eyes: Conjunctivae normal are normal. No scleral icterus.  Neck: Neck supple. No thyromegaly present.  Cardiovascular: Normal heart sounds.   Pulmonary/Chest: Breath sounds normal.  Abdominal: Soft. Bowel sounds are normal. She exhibits no distension and no mass. There is no rebound and no guarding.       No costovertebral tenderness Epigastric discomfort with deep palpation.  Lymphadenopathy:    She has no cervical adenopathy.  Neurological: She is alert and oriented to person, place, and time.  Skin: No rash noted. She is not diaphoretic.    ED Course  Procedures (including critical care time)  Labs Reviewed  POCT URINALYSIS DIP (DEVICE) - Abnormal; Notable for the following:    Leukocytes, UA SMALL (*)  Biochemical Testing Only. Please order routine urinalysis from main lab if confirmatory testing is needed.   All other components within normal limits  POCT PREGNANCY, URINE  URINE CULTURE   No results found.   1. Gastritis       MDM  Clinically well. No signs of dehydration.  Urinalysis normal other than small LA in the point-of-care urine. No urinary symptoms. Doubt urinary tract infection. Urine culture pending. Treated with omeprazole and ondansetron. Supportive care that should prompt patient return to medical attention discussed with patient and provided in writing.  Sharin Grave, MD 08/21/12 1039

## 2012-08-19 NOTE — ED Notes (Signed)
Pt reports nausea, vomiting since yesterday - denies body aches or fever

## 2012-08-20 LAB — URINE CULTURE
Colony Count: NO GROWTH
Culture: NO GROWTH

## 2012-09-19 ENCOUNTER — Other Ambulatory Visit: Payer: Self-pay

## 2013-06-10 ENCOUNTER — Other Ambulatory Visit: Payer: Self-pay

## 2017-08-15 DIAGNOSIS — S233XXA Sprain of ligaments of thoracic spine, initial encounter: Secondary | ICD-10-CM | POA: Diagnosis not present

## 2017-12-17 DIAGNOSIS — M25471 Effusion, right ankle: Secondary | ICD-10-CM | POA: Diagnosis not present

## 2018-01-24 DIAGNOSIS — B999 Unspecified infectious disease: Secondary | ICD-10-CM | POA: Diagnosis not present

## 2018-03-25 ENCOUNTER — Encounter: Payer: Self-pay | Admitting: Family Medicine

## 2018-03-25 ENCOUNTER — Ambulatory Visit (INDEPENDENT_AMBULATORY_CARE_PROVIDER_SITE_OTHER): Payer: BLUE CROSS/BLUE SHIELD | Admitting: Family Medicine

## 2018-03-25 VITALS — BP 126/62 | HR 106 | Temp 99.0°F | Resp 18 | Ht 63.78 in | Wt 196.5 lb

## 2018-03-25 DIAGNOSIS — Z130 Encounter for screening for diseases of the blood and blood-forming organs and certain disorders involving the immune mechanism: Secondary | ICD-10-CM

## 2018-03-25 DIAGNOSIS — D649 Anemia, unspecified: Secondary | ICD-10-CM | POA: Diagnosis not present

## 2018-03-25 DIAGNOSIS — Z23 Encounter for immunization: Secondary | ICD-10-CM

## 2018-03-25 DIAGNOSIS — E669 Obesity, unspecified: Secondary | ICD-10-CM | POA: Diagnosis not present

## 2018-03-25 DIAGNOSIS — L309 Dermatitis, unspecified: Secondary | ICD-10-CM | POA: Insufficient documentation

## 2018-03-25 DIAGNOSIS — Z13220 Encounter for screening for lipoid disorders: Secondary | ICD-10-CM

## 2018-03-25 DIAGNOSIS — Z113 Encounter for screening for infections with a predominantly sexual mode of transmission: Secondary | ICD-10-CM | POA: Diagnosis not present

## 2018-03-25 DIAGNOSIS — L308 Other specified dermatitis: Secondary | ICD-10-CM

## 2018-03-25 DIAGNOSIS — Z131 Encounter for screening for diabetes mellitus: Secondary | ICD-10-CM | POA: Diagnosis not present

## 2018-03-25 MED ORDER — TRIAMCINOLONE ACETONIDE 0.1 % EX CREA: 1.0000 "application " | TOPICAL_CREAM | Freq: Two times a day (BID) | CUTANEOUS | 0 refills | Status: DC

## 2018-03-25 MED ORDER — PIMECROLIMUS 1 % EX CREA: TOPICAL_CREAM | Freq: Two times a day (BID) | CUTANEOUS | 0 refills | Status: DC

## 2018-03-25 NOTE — Patient Instructions (Signed)
Obesity, Adult  Obesity is the condition of having too much total body fat. Being overweight or obese means that your weight is greater than what is considered healthy for your body size. Obesity is determined by a measurement called BMI. BMI is an estimate of body fat and is calculated from height and weight. For adults, a BMI of 30 or higher is considered obese.  Obesity can eventually lead to other health concerns and major illnesses, including:  · Stroke.  · Coronary artery disease (CAD).  · Type 2 diabetes.  · Some types of cancer, including cancers of the colon, breast, uterus, and gallbladder.  · Osteoarthritis.  · High blood pressure (hypertension).  · High cholesterol.  · Sleep apnea.  · Gallbladder stones.  · Infertility problems.    What are the causes?  The main cause of obesity is taking in (consuming) more calories than your body uses for energy. Other factors that contribute to this condition may include:  · Being born with genes that make you more likely to become obese.  · Having a medical condition that causes obesity. These conditions include:  ? Hypothyroidism.  ? Polycystic ovarian syndrome (PCOS).  ? Binge-eating disorder.  ? Cushing syndrome.  · Taking certain medicines, such as steroids, antidepressants, and seizure medicines.  · Not being physically active (sedentary lifestyle).  · Living where there are limited places to exercise safely or buy healthy foods.  · Not getting enough sleep.    What increases the risk?  The following factors may increase your risk of this condition:  · Having a family history of obesity.  · Being a woman of African-American descent.  · Being a man of Hispanic descent.    What are the signs or symptoms?  Having excessive body fat is the main symptom of this condition.  How is this diagnosed?  This condition may be diagnosed based on:  · Your symptoms.  · Your medical history.  · A physical exam. Your health care provider may measure:  ? Your BMI. If you are an  adult with a BMI between 25 and less than 30, you are considered overweight. If you are an adult with a BMI of 30 or higher, you are considered obese.  ? The distances around your hips and your waist (circumferences). These may be compared to each other to help diagnose your condition.  ? Your skinfold thickness. Your health care provider may gently pinch a fold of your skin and measure it.    How is this treated?  Treatment for this condition often includes changing your lifestyle. Treatment may include some or all of the following:  · Dietary changes. Work with your health care provider and a dietitian to set a weight-loss goal that is healthy and reasonable for you. Dietary changes may include eating:  ? Smaller portions. A portion size is the amount of a particular food that is healthy for you to eat at one time. This varies from person to person.  ? Low-calorie or low-fat options.  ? More whole grains, fruits, and vegetables.  · Regular physical activity. This may include aerobic activity (cardio) and strength training.  · Medicine to help you lose weight. Your health care provider may prescribe medicine if you are unable to lose 1 pound a week after 6 weeks of eating more healthily and doing more physical activity.  · Surgery. Surgical options may include gastric banding and gastric bypass. Surgery may be done if:  ? Other   treatments have not helped to improve your condition.  ? You have a BMI of 40 or higher.  ? You have life-threatening health problems related to obesity.    Follow these instructions at home:    Eating and drinking    · Follow recommendations from your health care provider about what you eat and drink. Your health care provider may advise you to:  ? Limit fast foods, sweets, and processed snack foods.  ? Choose low-fat options, such as low-fat milk instead of whole milk.  ? Eat 5 or more servings of fruits or vegetables every day.  ? Eat at home more often. This gives you more control over  what you eat.  ? Choose healthy foods when you eat out.  ? Learn what a healthy portion size is.  ? Keep low-fat snacks on hand.  ? Avoid sugary drinks, such as soda, fruit juice, iced tea sweetened with sugar, and flavored milk.  ? Eat a healthy breakfast.  · Drink enough water to keep your urine clear or pale yellow.  · Do not go without eating for long periods of time (do not fast) or follow a fad diet. Fasting and fad diets can be unhealthy and even dangerous.  Physical Activity  · Exercise regularly, as told by your health care provider. Ask your health care provider what types of exercise are safe for you and how often you should exercise.  · Warm up and stretch before being active.  · Cool down and stretch after being active.  · Rest between periods of activity.  Lifestyle  · Limit the time that you spend in front of your TV, computer, or video game system.  · Find ways to reward yourself that do not involve food.  · Limit alcohol intake to no more than 1 drink a day for nonpregnant women and 2 drinks a day for men. One drink equals 12 oz of beer, 5 oz of wine, or 1½ oz of hard liquor.  General instructions  · Keep a weight loss journal to keep track of the food you eat and how much you exercise you get.  · Take over-the-counter and prescription medicines only as told by your health care provider.  · Take vitamins and supplements only as told by your health care provider.  · Consider joining a support group. Your health care provider may be able to recommend a support group.  · Keep all follow-up visits as told by your health care provider. This is important.  Contact a health care provider if:  · You are unable to meet your weight loss goal after 6 weeks of dietary and lifestyle changes.  This information is not intended to replace advice given to you by your health care provider. Make sure you discuss any questions you have with your health care provider.  Document Released: 08/29/2004 Document Revised:  12/25/2015 Document Reviewed: 05/10/2015  Elsevier Interactive Patient Education © 2018 [employer] Inc[employer]

## 2018-03-25 NOTE — Progress Notes (Signed)
Name: Carla Cox   MRN: [id number]    DOB: May 01, 1986   Date:03/25/2018       Progress Note  Subjective  Chief Complaint  Chief Complaint  Patient presents with  . Establish Care    HPI   Obesity: she started to have weight problems after child birth. Today is her heaviest weight. She states around the divorce she was going to the gym and lost down to 153 lbs, however gradually gaining weight again. She states she does not craves sweets but likes carbohydrates. Discussed life style modification  Eczema: oldest sone also has it, she states her symptoms started a few years ago and seems to be getting worse. Right foot always has a rash, also sometimes on neck and both hands, only using otc lotion at this time. Sometimes itchy but not always. Worse during winter months.    Patient Active Problem List   Diagnosis Date Noted  . Obesity (BMI 30.0-34.9) 03/25/2018    Past Surgical History:  Procedure Laterality Date  . CESAREAN SECTION  2008   c-section  . TUBAL LIGATION  12/2008    Family History  Problem Relation Age of Onset  . Hypertension Mother   . Diabetes Father   . Hypertension Other   . Diabetes Other   . Diabetes Maternal Grandmother   . Hypertension Paternal Grandmother   . Diabetes Paternal Grandmother   . Heart attack Paternal Grandfather     Social History   Socioeconomic History  . Marital status: Divorced    Spouse name: Not on file  . Number of children: 2  . Years of education: Not on file  . Highest education level: Bachelor's degree (e.g., BA, AB, BS)  Occupational History  . Occupation: Airline pilotaccountant   Social Needs  . Financial resource strain: Not hard at all  . Food insecurity:    Worry: Never true    Inability: Never true  . Transportation needs:    Medical: No    Non-medical: No  Tobacco Use  . Smoking status: Never Smoker  . Smokeless tobacco: Never Used  Substance and Sexual Activity  . Alcohol use: Yes    Comment: occasionally   . Drug use: No  . Sexual activity: Yes    Partners: Male    Birth control/protection: Other-see comments    Comment: Tubal Ligation  Lifestyle  . Physical activity:    Days per week: 3 days    Minutes per session: 30 min  . Stress: Only a little  Relationships  . Social connections:    Talks on phone: More than three times a week    Gets together: Once a week    Attends religious service: Never    Active member of club or organization: No    Attends meetings of clubs or organizations: Never    Relationship status: Living with partner  . Intimate partner violence:    Fear of current or ex partner: No    Emotionally abused: No    Physically abused: No    Forced sexual activity: No  Other Topics Concern  . Not on file  Social History Narrative   Divorced since age 32 , has two sons - they live with father in [place], but [place] and weekends with mom       She lives with fiance since 2016    No current outpatient medications on file.  No Known Allergies   ROS  Constitutional: Negative for fever  or weight change.  Respiratory: Negative for cough and shortness of breath.   Cardiovascular: Negative for chest pain or palpitations.  Gastrointestinal: Negative for abdominal pain, no bowel changes.  Musculoskeletal: Negative for gait problem or joint swelling.  Skin: Positive  for rash.  Neurological: Negative for dizziness or headache.  No other specific complaints in a complete review of systems (except as listed in HPI above).  Objective  Vitals:   03/25/18 0859  BP: 126/62  Pulse: (!) 106  Resp: 18  Temp: 99 F (37.2 C)  TempSrc: Oral  SpO2: 98%  Weight: 196 lb 8 oz (89.1 kg)  Height: 5' 3.78" (1.62 m)    Body mass index is 33.96 kg/m.  Physical Exam  Constitutional: Patient appears well-developed and well-nourished. Obese No distress.  HEENT: head atraumatic, normocephalic, pupils equal and reactive to light,  neck  supple, throat within normal limits Cardiovascular: Normal rate, regular rhythm and normal heart sounds.  No murmur heard. No BLE edema. Pulmonary/Chest: Effort normal and breath sounds normal. No respiratory distress. Abdominal: Soft.  There is no tenderness. Skin: eczematous patch on right foot, right side of neck and both hands Psychiatric: Patient has a normal mood and affect. behavior is normal. Judgment and thought content normal.   PHQ2/9: Depression screen [facility] 2/9 03/25/2018  Decreased Interest 0  Down, Depressed, Hopeless 0  PHQ - 2 Score 0  Altered sleeping 0  Tired, decreased energy 1  Change in appetite 0  Feeling bad or failure about yourself  0  Trouble concentrating 0  Moving slowly or fidgety/restless 0  Suicidal thoughts 0  PHQ-9 Score 1  Difficult doing work/chores Not difficult at all   GAD 7 : Generalized Anxiety Score 03/25/2018  Nervous, Anxious, on Edge 0  Control/stop worrying 0  Worry too much - different things 0  Trouble relaxing 0  Restless 0  Easily annoyed or irritable 1  Afraid - awful might happen 0  Total GAD 7 Score 1  Anxiety Difficulty Not difficult at all    Fall Risk: Fall Risk  03/25/2018 03/25/2018  Falls in the past year? No No    Functional Status Survey: Is the patient deaf or have difficulty hearing?: No Does the patient have difficulty seeing, even when wearing glasses/contacts?: Yes(glasses and contacts) Does the patient have difficulty concentrating, remembering, or making decisions?: No Does the patient have difficulty walking or climbing stairs?: No Does the patient have difficulty dressing or bathing?: No Does the patient have difficulty doing errands alone such as visiting a doctor's office or shopping?: No    Assessment & Plan   1. Obesity (BMI 30.0-34.9)  Discussed with the patient the risk posed by an increased BMI. Discussed importance of portion control, calorie counting and at least 150 minutes of physical  activity weekly. Avoid sweet beverages and drink more water. Eat at least 6 servings of fruit and vegetables daily  - COMPLETE METABOLIC PANEL WITH GFR  2. Needs flu shot  - Flu Vaccine QUAD 36+ mos IM  3. Need for Tdap vaccination  - Tdap vaccine greater than or equal to 7yo IM  4. Diabetes mellitus screening  - Hemoglobin A1c  5. Screening, lipid  - Lipid panel  6. Screening, iron deficiency anemia  - CBC with Differential/Platelet  7. Other eczema  - triamcinolone cream (KENALOG) 0.1 %; Apply 1 application topically 2 (two) times daily.  Dispense: 30 g; Refill: 0 - pimecrolimus (ELIDEL) 1 % cream; Apply topically 2 (two) times  daily.  Dispense: 30 g; Refill: 0 - Ambulatory referral to Dermatology  8. Routine screening for STI (sexually transmitted infection)  - HIV antibody - [facility] - Hepatitis, Acute

## 2018-03-30 ENCOUNTER — Encounter: Payer: Self-pay | Admitting: Family Medicine

## 2018-03-31 ENCOUNTER — Encounter: Payer: Self-pay | Admitting: Family Medicine

## 2018-03-31 ENCOUNTER — Other Ambulatory Visit: Payer: Self-pay | Admitting: Family Medicine

## 2018-03-31 DIAGNOSIS — D509 Iron deficiency anemia, unspecified: Secondary | ICD-10-CM | POA: Insufficient documentation

## 2018-03-31 LAB — LIPID PANEL
Cholesterol: 108 mg/dL (ref <200)
HDL: 60 mg/dL (ref >50)
LDL Cholesterol (Calc): 34 mg/dL (calc)
NON-HDL CHOLESTEROL (CALC): 48 mg/dL (ref <130)
Total CHOL/HDL Ratio: 1.8 (calc) (ref <5.0)
Triglycerides: 62 mg/dL (ref <150)

## 2018-03-31 LAB — CBC WITH DIFFERENTIAL/PLATELET
BASOS ABS: 42 {cells}/uL (ref 0–200)
Basophils Relative: 0.5 %
Eosinophils Absolute: 67 cells/uL (ref 15–500)
Eosinophils Relative: 0.8 %
HCT: 35.4 % (ref 35.0–45.0)
Hemoglobin: 11.6 g/dL — ABNORMAL LOW (ref 11.7–15.5)
Lymphs Abs: 1453 cells/uL (ref 850–3900)
MCH: 27 pg (ref 27.0–33.0)
MCHC: 32.8 g/dL (ref 32.0–36.0)
MCV: 82.5 fL (ref 80.0–100.0)
MPV: 10.7 fL (ref 7.5–12.5)
Monocytes Relative: 5.3 %
NEUTROS PCT: 76.1 %
Neutro Abs: 6392 cells/uL (ref 1500–7800)
PLATELETS: 407 10*3/uL — AB (ref 140–400)
RBC: 4.29 10*6/uL (ref 3.80–5.10)
RDW: 14.8 % (ref 11.0–15.0)
TOTAL LYMPHOCYTE: 17.3 %
WBC: 8.4 10*3/uL (ref 3.8–10.8)
WBCMIX: 445 {cells}/uL (ref 200–950)

## 2018-03-31 LAB — HEMOGLOBIN A1C
EAG (MMOL/L): 6 (calc)
Hgb A1c MFr Bld: 5.4 % of total Hgb (ref <5.7)
MEAN PLASMA GLUCOSE: 108 (calc)

## 2018-03-31 LAB — COMPLETE METABOLIC PANEL WITH GFR
AG RATIO: 1.5 (calc) (ref 1.0–2.5)
ALKALINE PHOSPHATASE (APISO): 79 U/L (ref 33–115)
ALT: 11 U/L (ref 6–29)
AST: 16 U/L (ref 10–30)
Albumin: 4.6 g/dL (ref 3.6–5.1)
BILIRUBIN TOTAL: 0.3 mg/dL (ref 0.2–1.2)
BUN: 9 mg/dL (ref 7–25)
CHLORIDE: 104 mmol/L (ref 98–110)
CO2: 25 mmol/L (ref 20–32)
Calcium: 9.4 mg/dL (ref 8.6–10.2)
Creat: 0.92 mg/dL (ref 0.50–1.10)
GFR, Est African American: 94 mL/min/{1.73_m2} (ref >=60)
GFR, Est Non African American: 81 mL/min/{1.73_m2} (ref >=60)
GLOBULIN: 3.1 g/dL (ref 1.9–3.7)
Glucose, Bld: 83 mg/dL (ref 65–99)
POTASSIUM: 4.6 mmol/L (ref 3.5–5.3)
SODIUM: 138 mmol/L (ref 135–146)
Total Protein: 7.7 g/dL (ref 6.1–8.1)

## 2018-03-31 LAB — IRON,TIBC AND FERRITIN PANEL
%SAT: 12 % — AB (ref 16–45)
Ferritin: 11 ng/mL — ABNORMAL LOW (ref 16–154)
IRON: 47 ug/dL (ref 40–190)
TIBC: 388 ug/dL (ref 250–450)

## 2018-03-31 LAB — TEST AUTHORIZATION

## 2018-03-31 LAB — HEPATITIS PANEL, ACUTE
HEP B S AG: NONREACTIVE
HEP C AB: NONREACTIVE
Hep A IgM: NONREACTIVE
Hep B C IgM: NONREACTIVE
SIGNAL TO CUT-OFF: 0.02 (ref <1.00)

## 2018-03-31 LAB — RPR (MONITOR) W/REFL: RPR (Monitor) w/refl Titer: NONREACTIVE

## 2018-03-31 LAB — HIV ANTIBODY (ROUTINE TESTING W REFLEX): HIV 1&2 Ab, 4th Generation: NONREACTIVE

## 2018-03-31 MED ORDER — FERROUS SULFATE 325 (65 FE) MG PO TABS: 325.0000 mg | ORAL_TABLET | Freq: Two times a day (BID) | ORAL | 2 refills | Status: DC

## 2018-04-21 DIAGNOSIS — H168 Other keratitis: Secondary | ICD-10-CM | POA: Diagnosis not present

## 2018-05-21 ENCOUNTER — Encounter: Payer: Self-pay | Admitting: Family Medicine

## 2018-05-21 ENCOUNTER — Ambulatory Visit (INDEPENDENT_AMBULATORY_CARE_PROVIDER_SITE_OTHER): Payer: BLUE CROSS/BLUE SHIELD | Admitting: Family Medicine

## 2018-05-21 ENCOUNTER — Other Ambulatory Visit (HOSPITAL_COMMUNITY)
Admission: RE | Admit: 2018-05-21 | Discharge: 2018-05-21 | Disposition: A | Discharge status: Home / Self Care | Payer: BLUE CROSS/BLUE SHIELD | Source: Ambulatory Visit | Attending: Family Medicine | Admitting: Family Medicine

## 2018-05-21 VITALS — BP 126/78 | HR 92 | Temp 98.0°F | Resp 16 | Ht 64.0 in | Wt 193.5 lb

## 2018-05-21 DIAGNOSIS — R87612 Low grade squamous intraepithelial lesion on cytologic smear of cervix (LGSIL): Secondary | ICD-10-CM | POA: Diagnosis not present

## 2018-05-21 DIAGNOSIS — Z01419 Encounter for gynecological examination (general) (routine) without abnormal findings: Secondary | ICD-10-CM

## 2018-05-21 DIAGNOSIS — Z124 Encounter for screening for malignant neoplasm of cervix: Secondary | ICD-10-CM | POA: Insufficient documentation

## 2018-05-21 DIAGNOSIS — N920 Excessive and frequent menstruation with regular cycle: Secondary | ICD-10-CM

## 2018-05-21 DIAGNOSIS — D500 Iron deficiency anemia secondary to blood loss (chronic): Secondary | ICD-10-CM

## 2018-05-21 MED ORDER — NORGESTIMATE-ETH ESTRADIOL 0.25-35 MG-MCG PO TABS: 1.0000 | ORAL_TABLET | Freq: Every day | ORAL | 11 refills | Status: DC

## 2018-05-21 NOTE — Patient Instructions (Signed)
Iron-Rich Diet Iron is a mineral that helps your body to produce hemoglobin. Hemoglobin is a protein in your red blood cells that carries oxygen to your body's tissues. Eating too little iron may cause you to feel weak and tired, and it can increase your risk for infection. Eating enough iron is necessary for your body's metabolism, muscle function, and nervous system. Iron is naturally found in many foods. It can also be added to foods or fortified in foods. There are two types of dietary iron:  Heme iron. Heme iron is absorbed by the body more easily than nonheme iron. Heme iron is found in meat, poultry, and fish.  Nonheme iron. Nonheme iron is found in dietary supplements, iron-fortified grains, beans, and vegetables.  You may need to follow an iron-rich diet if:  You have been diagnosed with iron deficiency or iron-deficiency anemia.  You have a condition that prevents you from absorbing dietary iron, such as: ? Infection in your intestines. ? Celiac disease. This involves long-lasting (chronic) inflammation of your intestines.  You do not eat enough iron.  You eat a diet that is high in foods that impair iron absorption.  You have lost a lot of blood.  You have heavy bleeding during your menstrual cycle.  You are pregnant.  What is my plan? Your health care provider may help you to determine how much iron you need per day based on your condition. Generally, when a person consumes sufficient amounts of iron in the diet, the following iron needs are met:  Men. ? 14-18 years old: 11 mg per day. ? 19-50 years old: 8 mg per day.  Women. ? 14-18 years old: 15 mg per day. ? 19-50 years old: 18 mg per day. ? Over 50 years old: 8 mg per day. ? Pregnant women: 27 mg per day. ? Breastfeeding women: 9 mg per day.  What do I need to know about an iron-rich diet?  Eat fresh fruits and vegetables that are high in vitamin C along with foods that are high in iron. This will help  increase the amount of iron that your body absorbs from food, especially with foods containing nonheme iron. Foods that are high in vitamin C include oranges, peppers, tomatoes, and mango.  Take iron supplements only as directed by your health care provider. Overdose of iron can be life-threatening. If you were prescribed iron supplements, take them with orange juice or a vitamin C supplement.  Cook foods in pots and pans that are made from iron.  Eat nonheme iron-containing foods alongside foods that are high in heme iron. This helps to improve your iron absorption.  Certain foods and drinks contain compounds that impair iron absorption. Avoid eating these foods in the same meal as iron-rich foods or with iron supplements. These include: ? Coffee, black tea, and red wine. ? Milk, dairy products, and foods that are high in calcium. ? Beans, soybeans, and peas. ? Whole grains.  When eating foods that contain both nonheme iron and compounds that impair iron absorption, follow these tips to absorb iron better. ? Soak beans overnight before cooking. ? Soak whole grains overnight and drain them before using. ? Ferment flours before baking, such as using yeast in bread dough. What foods can I eat? Grains Iron-fortified breakfast cereal. Iron-fortified whole-wheat bread. Enriched rice. Sprouted grains. Vegetables Spinach. Potatoes with skin. Green peas. Broccoli. Red and green bell peppers. Fermented vegetables. Fruits Prunes. Raisins. Oranges. Strawberries. Mango. Grapefruit. Meats and Other Protein Sources   Beef liver. Oysters. Beef. Shrimp. [place]. Chicken. [place]. Sardines. Chickpeas. Nuts. Tofu. Beverages Tomato juice. Fresh orange juice. Prune juice. Hibiscus tea. Fortified instant breakfast shakes. Condiments Tahini. Fermented soy sauce. Sweets and Desserts Black-strap molasses. Other Wheat germ. The items listed above may not be a complete list of recommended foods or beverages.  Contact your dietitian for more options. What foods are not recommended? Grains Whole grains. Bran cereal. Bran flour. Oats. Vegetables Artichokes. Brussels sprouts. Kale. Fruits Blueberries. Raspberries. Strawberries. Figs. Meats and Other Protein Sources Soybeans. Products made from soy protein. Dairy Milk. Cream. Cheese. Yogurt. Cottage cheese. Beverages Coffee. Black tea. Red wine. Sweets and Desserts Cocoa. Chocolate. Ice cream. Other Basil. Oregano. Parsley. The items listed above may not be a complete list of foods and beverages to avoid. Contact your dietitian for more information. This information is not intended to replace advice given to you by your health care provider. Make sure you discuss any questions you have with your health care provider. Document Released: 03/05/2005 Document Revised: 02/09/2016 Document Reviewed: 02/16/2014 Elsevier Interactive Patient Education  2018 [place] 18-39 Years, Female Preventive care refers to lifestyle choices and visits with your health care provider that can promote health and wellness. What does preventive care include?  A yearly physical exam. This is also called an annual well check.  Dental exams once or twice a year.  Routine eye exams. Ask your health care provider how often you should have your eyes checked.  Personal lifestyle choices, including: ? Daily care of your teeth and gums. ? Regular physical activity. ? Eating a healthy diet. ? Avoiding tobacco and drug use. ? Limiting alcohol use. ? Practicing safe sex. ? Taking vitamin and mineral supplements as recommended by your health care provider. What happens during an annual well check? The services and screenings done by your health care provider during your annual well check will depend on your age, overall health, lifestyle risk factors, and family history of disease. Counseling Your health care provider may ask you questions about  your:  Alcohol use.  Tobacco use.  Drug use.  Emotional well-being.  Home and relationship well-being.  Sexual activity.  Eating habits.  Work and work Statistician.  Method of birth control.  Menstrual cycle.  Pregnancy history.  Screening You may have the following tests or measurements:  Height, weight, and BMI.  Diabetes screening. This is done by checking your blood sugar (glucose) after you have not eaten for a while (fasting).  Blood pressure.  Lipid and cholesterol levels. These may be checked every 5 years starting at age 63.  Skin check.  Hepatitis C blood test.  Hepatitis B blood test.  Sexually transmitted disease (STD) testing.  BRCA-related cancer screening. This may be done if you have a family history of breast, ovarian, tubal, or peritoneal cancers.  Pelvic exam and Pap test. This may be done every 3 years starting at age 80. Starting at age 17, this may be done every 5 years if you have a Pap test in combination with an HPV test.  Discuss your test results, treatment options, and if necessary, the need for more tests with your health care provider. Vaccines Your health care provider may recommend certain vaccines, such as:  Influenza vaccine. This is recommended every year.  Tetanus, diphtheria, and acellular pertussis (Tdap, Td) vaccine. You may need a Td booster every 10 years.  Varicella vaccine. You may need this if you have not been vaccinated.  HPV vaccine. If  you are 2 or younger, you may need three doses over 6 months.  Measles, mumps, and rubella (MMR) vaccine. You may need at least one dose of MMR. You may also need a second dose.  Pneumococcal 13-valent conjugate (PCV13) vaccine. You may need this if you have certain conditions and were not previously vaccinated.  Pneumococcal polysaccharide (PPSV23) vaccine. You may need one or two doses if you smoke cigarettes or if you have certain conditions.  Meningococcal vaccine. One  dose is recommended if you are age 45-21 years and a first-year college student living in a residence hall, or if you have one of several medical conditions. You may also need additional booster doses.  Hepatitis A vaccine. You may need this if you have certain conditions or if you travel or work in places where you may be exposed to hepatitis A.  Hepatitis B vaccine. You may need this if you have certain conditions or if you travel or work in places where you may be exposed to hepatitis B.  Haemophilus influenzae type b (Hib) vaccine. You may need this if you have certain risk factors.  Talk to your health care provider about which screenings and vaccines you need and how often you need them. This information is not intended to replace advice given to you by your health care provider. Make sure you discuss any questions you have with your health care provider. Document Released: 09/17/2001 Document Revised: 04/10/2016 Document Reviewed: 05/23/2015 Elsevier Interactive Patient Education  [employer].

## 2018-05-21 NOTE — Progress Notes (Signed)
Name: Carla Cox   MRN: [id number]    DOB: [birth year]   Date:05/21/2018       Progress Note  Subjective  Chief Complaint  Chief Complaint  Patient presents with  . Annual Exam    patient sleeps about 6hrs per night. patient stated that she eats a well balanced diet most of the time. lots of fruits & vegetables and water  . Labs Only    iron     HPI   Patient presents for annual CPE   Diet: she has been cutting down on carbs and lost a little weight, she has been iron otc twice daily, last ferritin was low with mild anemia  USPSTF grade A and B recommendations    Office Visit from 05/21/2018 in [facility]  AUDIT-C Score  2     Depression:  Depression screen [facility] 2/9 05/21/2018 03/25/2018  Decreased Interest 0 0  Down, Depressed, Hopeless 0 0  PHQ - 2 Score 0 0  Altered sleeping 0 0  Tired, decreased energy 0 1  Change in appetite 0 0  Feeling bad or failure about yourself  0 0  Trouble concentrating 0 0  Moving slowly or fidgety/restless 0 0  Suicidal thoughts 0 0  PHQ-9 Score 0 1  Difficult doing work/chores - Not difficult at all   Hypertension: BP Readings from Last 3 Encounters:  05/21/18 126/78  03/25/18 126/62  08/19/12 135/78   Obesity: Wt Readings from Last 3 Encounters:  05/21/18 193 lb 8 oz (87.8 kg)  03/25/18 196 lb 8 oz (89.1 kg)   BMI Readings from Last 3 Encounters:  05/21/18 33.21 kg/m  03/25/18 33.96 kg/m    Hep C Screening: up to date  STD testing and prevention (HIV/chl/gon/syphilis): up to date  Intimate partner violence: negative  Sexual History/Pain during Intercourse: no pain, no vaginal discharge  Menstrual History/LMP/Abnormal Bleeding: regular, every 25 days and lasts 4-5 days and it is heavy, discussed opc to decrease volume of cycle since she has anemia - she has taken ocp before without problems Incontinence Symptoms: no  Advanced Care Planning: A voluntary discussion about advance care planning  including the explanation and discussion of advance directives.  Discussed health care proxy and Living will, and the patient was able to identify a health care proxy as mother .  Patient does not have a living will at present time.   Breast cancer: not indicated  BRCA gene screening: she will think about it and let me know when she is ready  Cervical cancer screening: today   Osteoporosis Screening: discussed high calcium diet  Lipids:  Lab Results  Component Value Date   CHOL 108 03/25/2018   Lab Results  Component Value Date   HDL 60 03/25/2018   Lab Results  Component Value Date   LDLCALC 34 03/25/2018   Lab Results  Component Value Date   TRIG 62 03/25/2018   Lab Results  Component Value Date   CHOLHDL 1.8 03/25/2018   No results found for: LDLDIRECT  Glucose:  Glucose, Bld  Date Value Ref Range Status  03/25/2018 83 65 - 99 mg/dL Final    Comment:    .            Fasting reference interval .   08/06/2011 88 70 - 99 mg/dL Final  05/08/2008 100 (H)  Final    Skin cancer: discussed atypical lesions  Colorectal cancer: not indicated  Lung cancer:   Low Dose CT  Chest recommended if Age 95-80 years, 30 pack-year currently smoking OR have quit w/in 15years. Patient does not qualify.    Patient Active Problem List   Diagnosis Date Noted  . Iron deficiency anemia 03/31/2018  . Obesity (BMI 30.0-34.9) 03/25/2018  . Eczema 03/25/2018    Past Surgical History:  Procedure Laterality Date  . CESAREAN SECTION  2008   c-section  . TUBAL LIGATION  12/2008    Family History  Problem Relation Age of Onset  . Hypertension Mother   . Diabetes Father   . Hypertension Other   . Diabetes Other   . Diabetes Maternal Grandmother   . Hypertension Paternal Grandmother   . Diabetes Paternal Grandmother   . Heart attack Paternal Grandfather     Social History   Socioeconomic History  . Marital status: Divorced    Spouse name: Not on file  . Number of children: 2   . Years of education: Not on file  . Highest education level: Bachelor's degree (e.g., BA, AB, BS)  Occupational History  . Occupation: Optometrist   Social Needs  . Financial resource strain: Not hard at all  . Food insecurity:    Worry: Never true    Inability: Never true  . Transportation needs:    Medical: No    Non-medical: No  Tobacco Use  . Smoking status: Never Smoker  . Smokeless tobacco: Never Used  Substance and Sexual Activity  . Alcohol use: Yes    Comment: occasionally  . Drug use: No  . Sexual activity: Yes    Partners: Male    Birth control/protection: Other-see comments    Comment: Tubal Ligation  Lifestyle  . Physical activity:    Days per week: 3 days    Minutes per session: 30 min  . Stress: Only a little  Relationships  . Social connections:    Talks on phone: More than three times a week    Gets together: Once a week    Attends religious service: Never    Active member of club or organization: No    Attends meetings of clubs or organizations: Never    Relationship status: Living with partner  . Intimate partner violence:    Fear of current or ex partner: No    Emotionally abused: No    Physically abused: No    Forced sexual activity: No  Other Topics Concern  . Not on file  Social History Narrative   Divorced since age 22 , has two sons - they live with father in [place] - better school district, but spend [place] and weekends with mom       She lives with fiance since 2016     Current Outpatient Medications:  .  ferrous sulfate 325 (65 FE) MG tablet, Take 1 tablet (325 mg total) by mouth 2 (two) times daily with a meal., Disp: 60 tablet, Rfl: 2 .  pimecrolimus (ELIDEL) 1 % cream, Apply topically 2 (two) times daily., Disp: 30 g, Rfl: 0 .  tobramycin-dexamethasone (TOBRADEX) ophthalmic solution, INSTILL ONE DROP INTO THE LEFT EYE FOUR TIMES DAILY AS DIRECTED (SHAKE WELL BEFORE USING), Disp: , Rfl: 0 .  triamcinolone cream (KENALOG) 0.1 %,  Apply 1 application topically 2 (two) times daily., Disp: 30 g, Rfl: 0  No Known Allergies   ROS  Constitutional: Negative for fever or weight change.  Respiratory: Negative for cough and shortness of breath.   Cardiovascular: Negative for chest pain or palpitations.  Gastrointestinal: Negative for abdominal  pain, no bowel changes.  Musculoskeletal: Negative for gait problem or joint swelling.  Skin: Negative for rash.  Neurological: Negative for dizziness or headache.  No other specific complaints in a complete review of systems (except as listed in HPI above).  Objective  Vitals:   05/21/18 0756  BP: 126/78  Pulse: 92  Resp: 16  Temp: 98 F (36.7 C)  TempSrc: Oral  SpO2: 99%  Weight: 193 lb 8 oz (87.8 kg)  Height: _0  (1.626 m)    Body mass index is 33.21 kg/m.  Physical Exam  Constitutional: Patient appears well-developed and well-nourished. No distress.  HENT: Head: Normocephalic and atraumatic. Ears: B TMs ok, no erythema or effusion; Nose: Nose normal. Mouth/Throat: Oropharynx is clear and moist. No oropharyngeal exudate.  Eyes: Conjunctivae and EOM are normal. Pupils are equal, round, and reactive to light. No scleral icterus.  Neck: Normal range of motion. Neck supple. No JVD present. No thyromegaly present.  Cardiovascular: Normal rate, regular rhythm and normal heart sounds.  No murmur heard. No BLE edema. Pulmonary/Chest: Effort normal and breath sounds normal. No respiratory distress. Abdominal: Soft. Bowel sounds are normal, no distension. There is no tenderness. no masses Breast: no lumps or masses, no nipple discharge or rashes FEMALE GENITALIA:  External genitalia normal External urethra normal Vaginal vault normal without discharge or lesions Cervix normal without discharge or lesions Bimanual exam normal without masses RECTAL: no rectal masses or hemorrhoids Musculoskeletal: Normal range of motion, no joint effusions. No gross  deformities Neurological: he is alert and oriented to person, place, and time. No cranial nerve deficit. Coordination, balance, strength, speech and gait are normal.  Skin: Skin is warm and dry. No rash noted. No erythema.  Psychiatric: Patient has a normal mood and affect. behavior is normal. Judgment and thought content normal.   Recent Results (from the past 2160 hour(s))  CBC with Differential/Platelet     Status: Abnormal   Collection Time: 03/25/18  9:54 AM  Result Value Ref Range   WBC 8.4 3.8 - 10.8 Thousand/uL   RBC 4.29 3.80 - 5.10 Million/uL   Hemoglobin 11.6 (L) 11.7 - 15.5 g/dL   HCT 35.4 35.0 - 45.0 %   MCV 82.5 80.0 - 100.0 fL   MCH 27.0 27.0 - 33.0 pg   MCHC 32.8 32.0 - 36.0 g/dL   RDW 14.8 11.0 - 15.0 %   Platelets 407 (H) 140 - 400 Thousand/uL   MPV 10.7 7.5 - 12.5 fL   Neutro Abs 6,392 1,500 - 7,800 cells/uL   Lymphs Abs 1,453 850 - 3,900 cells/uL   WBC mixed population 445 200 - 950 cells/uL   Eosinophils Absolute 67 15 - 500 cells/uL   Basophils Absolute 42 0 - 200 cells/uL   Neutrophils Relative % 76.1 %   Total Lymphocyte 17.3 %   Monocytes Relative 5.3 %   Eosinophils Relative 0.8 %   Basophils Relative 0.5 %  COMPLETE METABOLIC PANEL WITH GFR     Status: None   Collection Time: 03/25/18  9:54 AM  Result Value Ref Range   Glucose, Bld 83 65 - 99 mg/dL    Comment: .            Fasting reference interval .    BUN 9 7 - 25 mg/dL   Creat 0.92 0.50 - 1.10 mg/dL   GFR, Est Non African American 81 > OR = 60 mL/min/1.22m   GFR, Est African American 94 > OR = 60 mL/min/1.773m  BUN/Creatinine Ratio NOT APPLICABLE 6 - 22 (calc)   Sodium 138 135 - 146 mmol/L   Potassium 4.6 3.5 - 5.3 mmol/L   Chloride 104 98 - 110 mmol/L   CO2 25 20 - 32 mmol/L   Calcium 9.4 8.6 - 10.2 mg/dL   Total Protein 7.7 6.1 - 8.1 g/dL   Albumin 4.6 3.6 - 5.1 g/dL   Globulin 3.1 1.9 - 3.7 g/dL (calc)   AG Ratio 1.5 1.0 - 2.5 (calc)   Total Bilirubin 0.3 0.2 - 1.2 mg/dL    Alkaline phosphatase (APISO) 79 33 - 115 U/L   AST 16 10 - 30 U/L   ALT 11 6 - 29 U/L  Hemoglobin A1c     Status: None   Collection Time: 03/25/18  9:54 AM  Result Value Ref Range   Hgb A1c MFr Bld 5.4 <5.7 % of total Hgb    Comment: For the purpose of screening for the presence of diabetes: . <5.7%       Consistent with the absence of diabetes 5.7-6.4%    Consistent with increased risk for diabetes             (prediabetes) > or =6.5%  Consistent with diabetes . This assay result is consistent with a decreased risk of diabetes. . Currently, no consensus exists regarding use of hemoglobin A1c for diagnosis of diabetes in children. . According to American Diabetes Association (ADA) guidelines, hemoglobin A1c <7.0% represents optimal control in non-pregnant diabetic patients. Different metrics may apply to specific patient populations.  Standards of Medical Care in Diabetes(ADA). .    Mean Plasma Glucose 108 (calc)   eAG (mmol/L) 6.0 (calc)  Lipid panel     Status: None   Collection Time: 03/25/18  9:54 AM  Result Value Ref Range   Cholesterol 108 <200 mg/dL   HDL 60 >50 mg/dL   Triglycerides 62 <150 mg/dL   LDL Cholesterol (Calc) 34 mg/dL (calc)    Comment: Reference range: <100 . Desirable range <100 mg/dL for primary prevention;   <70 mg/dL for patients with CHD or diabetic patients  with > or = 2 CHD risk factors. Marland Kitchen LDL-C is now calculated using the Martin-Hopkins  calculation, which is a validated novel method providing  better accuracy than the Friedewald equation in the  estimation of LDL-C.  Cresenciano Genre et al. Annamaria Helling. 8546;270(35): 2061-2068  (http://education.[facility].com/faq/FAQ164)    Total CHOL/HDL Ratio 1.8 <5.0 (calc)   Non-HDL Cholesterol (Calc) 48 <130 mg/dL (calc)    Comment: For patients with diabetes plus 1 major ASCVD risk  factor, treating to a non-HDL-C goal of <100 mg/dL  (LDL-C of <70 mg/dL) is considered a therapeutic  option.    Hepatitis panel, acute     Status: None   Collection Time: 03/25/18  9:54 AM  Result Value Ref Range   Hep A IgM NON-REACTIVE NON-REACTI   Hepatitis B Surface Ag NON-REACTIVE NON-REACTI   Hep B C IgM NON-REACTIVE NON-REACTI   Hepatitis C Ab NON-REACTIVE NON-REACTI   SIGNAL TO CUT-OFF 0.02 <1.00    Comment: . HCV antibody was non-reactive. There is no laboratory  evidence of HCV infection. . In most cases, no further action is required. However, if recent HCV exposure is suspected, a test for HCV RNA (test code [phone]) is suggested. . For additional information please refer to http://education.[facility].com/faq/FAQ22v1 (This link is being provided for informational/ educational purposes only.) .   HIV antibody     Status: None   Collection  Time: 03/25/18  9:54 AM  Result Value Ref Range   HIV 1&2 Ab, 4th Generation NON-REACTIVE NON-REACTI    Comment: HIV-1 antigen and HIV-1/HIV-2 antibodies were not detected. There is no laboratory evidence of HIV infection. Marland Kitchen PLEASE NOTE: This information has been disclosed to you from records whose confidentiality may be protected by state law.  If your state requires such protection, then the state law prohibits you from making any further disclosure of the information without the specific written consent of the person to whom it pertains, or as otherwise permitted by law. A general authorization for the release of medical or other information is NOT sufficient for this purpose. . For additional information please refer to http://education.[facility].com/faq/FAQ106 (This link is being provided for informational/ educational purposes only.) . Marland Kitchen The performance of this assay has not been clinically validated in patients less than 81 years old. .   RPR (MONITOR) W/REFL     Status: None   Collection Time: 03/25/18  9:54 AM  Result Value Ref Range   RPR (Monitor) w/refl Titer NON-REACTIVE NON-REACTI  Iron, TIBC and  Ferritin Panel     Status: Abnormal   Collection Time: 03/25/18  9:54 AM  Result Value Ref Range   Iron 47 40 - 190 mcg/dL   TIBC 388 250 - 450 mcg/dL (calc)   %SAT 12 (L) 16 - 45 % (calc)   Ferritin 11 (L) 16 - 154 ng/mL  TEST AUTHORIZATION     Status: None   Collection Time: 03/25/18  9:54 AM  Result Value Ref Range   TEST NAME: IRON, TIBC AND FERRITIN PANEL    TEST CODE: [id number]    CLIENT CONTACT: CINDY CANTY    REPORT ALWAYS MESSAGE SIGNATURE      Comment: . The laboratory testing on this patient was verbally requested or confirmed by the ordering physician or his or her authorized representative after contact with an employee of [employer]. Federal regulations require that we maintain on file written authorization for all laboratory testing.  Accordingly we are asking that the ordering physician or his or her authorized representative sign a copy of this report and promptly return it to the client service representative. . . Signature:____________________________________________________ . Please fax this signed page to [phone] or return it via your [employer] courier.      PHQ2/9: Depression screen [facility] 2/9 05/21/2018 03/25/2018  Decreased Interest 0 0  Down, Depressed, Hopeless 0 0  PHQ - 2 Score 0 0  Altered sleeping 0 0  Tired, decreased energy 0 1  Change in appetite 0 0  Feeling bad or failure about yourself  0 0  Trouble concentrating 0 0  Moving slowly or fidgety/restless 0 0  Suicidal thoughts 0 0  PHQ-9 Score 0 1  Difficult doing work/chores - Not difficult at all     Fall Risk: Fall Risk  05/21/2018 03/25/2018 03/25/2018  Falls in the past year? No No No    Functional Status Survey: Is the patient deaf or have difficulty hearing?: No Does the patient have difficulty seeing, even when wearing glasses/contacts?: Yes(glasses and contacts) Does the patient have difficulty concentrating, remembering, or making decisions?: No Does  the patient have difficulty walking or climbing stairs?: No Does the patient have difficulty dressing or bathing?: No Does the patient have difficulty doing errands alone such as visiting a doctor's office or shopping?: No   Assessment & Plan  1. Well woman exam   2. Iron deficiency anemia due to  chronic blood loss  - Iron, TIBC and Ferritin Panel - CBC with Differential/Platelet - norgestimate-ethinyl estradiol (SPRINTEC 28) 0.25-35 MG-MCG tablet; Take 1 tablet by mouth daily.  Dispense: 1 Package; Refill: 11  3. Cervical cancer screening  - Cytology - PAP  4. Menorrhagia with regular cycle  - norgestimate-ethinyl estradiol (SPRINTEC 28) 0.25-35 MG-MCG tablet; Take 1 tablet by mouth daily.  Dispense: 1 Package; Refill: 11 -USPSTF grade A and B recommendations reviewed with patient; age-appropriate recommendations, preventive care, screening tests, etc discussed and encouraged; healthy living encouraged; see AVS for patient education given to patient -Discussed importance of 150 minutes of physical activity weekly, eat two servings of fish weekly, eat one serving of tree nuts ( cashews, pistachios, pecans, almonds.Marland Kitchen) every other day, eat 6 servings of fruit/vegetables daily and drink plenty of water and avoid sweet beverages.

## 2018-05-22 LAB — IRON,TIBC AND FERRITIN PANEL
%SAT: 84 % (calc) — ABNORMAL HIGH (ref 16–45)
Ferritin: 29 ng/mL (ref 16–154)
Iron: 313 ug/dL — ABNORMAL HIGH (ref 40–190)
TIBC: 371 ug/dL (ref 250–450)

## 2018-05-22 LAB — CBC WITH DIFFERENTIAL/PLATELET
BASOS PCT: 0.5 %
Basophils Absolute: 40 cells/uL (ref 0–200)
Eosinophils Absolute: 47 cells/uL (ref 15–500)
Eosinophils Relative: 0.6 %
HEMATOCRIT: 36.4 % (ref 35.0–45.0)
Hemoglobin: 12.1 g/dL (ref 11.7–15.5)
LYMPHS ABS: 1367 {cells}/uL (ref 850–3900)
MCH: 28.4 pg (ref 27.0–33.0)
MCHC: 33.2 g/dL (ref 32.0–36.0)
MCV: 85.4 fL (ref 80.0–100.0)
MPV: 10.8 fL (ref 7.5–12.5)
Monocytes Relative: 4.7 %
NEUTROS PCT: 76.9 %
Neutro Abs: 6075 cells/uL (ref 1500–7800)
PLATELETS: 357 10*3/uL (ref 140–400)
RBC: 4.26 10*6/uL (ref 3.80–5.10)
RDW: 15 % (ref 11.0–15.0)
TOTAL LYMPHOCYTE: 17.3 %
WBC: 7.9 10*3/uL (ref 3.8–10.8)
WBCMIX: 371 {cells}/uL (ref 200–950)

## 2018-05-25 ENCOUNTER — Encounter: Payer: Self-pay | Admitting: Family Medicine

## 2018-05-25 DIAGNOSIS — R87612 Low grade squamous intraepithelial lesion on cytologic smear of cervix (LGSIL): Secondary | ICD-10-CM | POA: Insufficient documentation

## 2018-05-25 LAB — CYTOLOGY - PAP
Chlamydia: NEGATIVE
HPV: DETECTED — AB
Neisseria Gonorrhea: NEGATIVE

## 2018-05-26 ENCOUNTER — Other Ambulatory Visit: Payer: Self-pay | Admitting: Family Medicine

## 2018-05-26 DIAGNOSIS — R87612 Low grade squamous intraepithelial lesion on cytologic smear of cervix (LGSIL): Secondary | ICD-10-CM

## 2018-05-27 ENCOUNTER — Telehealth: Payer: Self-pay | Admitting: Obstetrics & Gynecology

## 2018-05-27 NOTE — Telephone Encounter (Signed)
[facility] referring Low grade squamous intraepithelial lesion (LGSIL) on cervical Pap smear. Unable to leave voicemail due to voicemail box full

## 2018-05-27 NOTE — Telephone Encounter (Signed)
Patient is schedule 06/08/18 with RPH °

## 2018-06-08 ENCOUNTER — Other Ambulatory Visit (HOSPITAL_COMMUNITY)
Admission: RE | Admit: 2018-06-08 | Discharge: 2018-06-08 | Disposition: A | Discharge status: Home / Self Care | Payer: BLUE CROSS/BLUE SHIELD | Source: Ambulatory Visit | Attending: Obstetrics & Gynecology | Admitting: Obstetrics & Gynecology

## 2018-06-08 ENCOUNTER — Ambulatory Visit (INDEPENDENT_AMBULATORY_CARE_PROVIDER_SITE_OTHER): Payer: BLUE CROSS/BLUE SHIELD | Admitting: Obstetrics & Gynecology

## 2018-06-08 ENCOUNTER — Encounter: Payer: Self-pay | Admitting: Obstetrics & Gynecology

## 2018-06-08 VITALS — BP 140/70 | Ht 64.0 in | Wt 194.0 lb

## 2018-06-08 DIAGNOSIS — R87612 Low grade squamous intraepithelial lesion on cytologic smear of cervix (LGSIL): Secondary | ICD-10-CM | POA: Diagnosis not present

## 2018-06-08 DIAGNOSIS — N871 Moderate cervical dysplasia: Secondary | ICD-10-CM | POA: Diagnosis not present

## 2018-06-08 NOTE — Progress Notes (Signed)
Referring Provider:  Dr Carlynn Purl  HPI:  Carla Cox is a 32 y.o.  [phone]  who presents today for evaluation and management of abnormal cervical cytology.    Dysplasia History:  LGSIL recent PAP  ROS:  Pertinent items are noted in HPI.  OB History  Gravida Para Term Preterm AB Living  2 2 2  0 0 2  SAB TAB Ectopic Multiple Live Births               # Outcome Date GA Lbr Len/2nd Weight Sex Delivery Anes PTL Lv  2 Term           1 Term             Obstetric Comments  One emergency cesarean because of decelerations     Past Medical History:  Diagnosis Date  . Anxiety     Past Surgical History:  Procedure Laterality Date  . CESAREAN SECTION  2008   c-section  . TUBAL LIGATION  12/2008    SOCIAL HISTORY: Social History   Substance and Sexual Activity  Alcohol Use Yes   Comment: occasionally   Social History   Substance and Sexual Activity  Drug Use No     Family History  Problem Relation Age of Onset  . Hypertension Mother   . Diabetes Father   . Hypertension Other   . Diabetes Other   . Diabetes Maternal Grandmother   . Hypertension Paternal Grandmother   . Diabetes Paternal Grandmother   . Heart attack Paternal Grandfather     ALLERGIES:  Patient has no known allergies.  Current Outpatient Medications on File Prior to Visit  Medication Sig Dispense Refill  . ferrous sulfate 325 (65 FE) MG tablet Take 1 tablet (325 mg total) by mouth 2 (two) times daily with a meal. 60 tablet 2  . norgestimate-ethinyl estradiol (SPRINTEC 28) 0.25-35 MG-MCG tablet Take 1 tablet by mouth daily. 1 Package 11  . pimecrolimus (ELIDEL) 1 % cream Apply topically 2 (two) times daily. (Patient not taking: Reported on 06/08/2018) 30 g 0  . tobramycin-dexamethasone (TOBRADEX) ophthalmic solution INSTILL ONE DROP INTO THE LEFT EYE FOUR TIMES DAILY AS DIRECTED (SHAKE WELL BEFORE USING)  0  . triamcinolone cream (KENALOG) 0.1 % Apply 1 application topically 2 (two) times daily.  (Patient not taking: Reported on 06/08/2018) 30 g 0   No current facility-administered medications on file prior to visit.     Physical Exam: -Vitals:  BP 140/70   Ht 5\' 4"  (1.626 m)   Wt 194 lb (88 kg)   LMP 05/23/2018   BMI 33.30 kg/m  GEN: WD, WN, NAD.  A+ O x 3, good mood and affect. ABD:  NT, ND.  Soft, no masses.  No hernias noted.   Pelvic:   Vulva: Normal appearance.  No lesions.  Vagina: No lesions or abnormalities noted.  Support: Normal pelvic support.  Urethra No masses tenderness or scarring.  Meatus Normal size without lesions or prolapse.  Cervix: See below.  Anus: Normal exam.  No lesions.  Perineum: Normal exam.  No lesions.        Bimanual   Uterus: Normal size.  Non-tender.  Mobile.  AV.  Adnexae: No masses.  Non-tender to palpation.  Cul-de-sac: Negative for abnormality.   PROCEDURE: 1.  Urine Pregnancy Test:  not done 2.  Colposcopy performed with 4% acetic acid after verbal consent obtained                                         -  Aceto-white Lesions Location(s): none.              -Biopsy performed at 6, 12 o'clock               -ECC indicated and performed: Yes.       -Biopsy sites made hemostatic with pressure, AgNO3, and/or Monsel's solution   -Satisfactory colposcopy: Yes.      -Evidence of Invasive cervical CA :  NO  ASSESSMENT:  Carla Cox is a 32 y.o. [id number] here for  1. LGSIL on Pap smear of cervix   .  PLAN: 1.  I discussed the grading system of pap smears and HPV high risk viral types.  We will discuss and base management after colpo results return. 2. Follow up PAP 6 months, vs intervention if high grade dysplasia identified 3. Treatment of persistantly abnormal PAP smears and cervical dysplasia, even mild, is discussed w pt today in detail, as well as the pros and cons of Cryo and LEEP procedures. Will consider and discuss after results.      Annamarie Major, MD, [facility], [facility] 06/08/2018  8:38  AM

## 2018-06-08 NOTE — Patient Instructions (Signed)

## 2018-06-11 ENCOUNTER — Telehealth: Payer: Self-pay | Admitting: Obstetrics & Gynecology

## 2018-06-11 NOTE — Progress Notes (Signed)
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 Cryo vs LEEP discussed as options for management as well as importance of follow up .

## 2018-06-11 NOTE — Telephone Encounter (Signed)
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 Cryo vs LEEP discussed as options for management as well as importance of follow up .

## 2018-06-11 NOTE — Telephone Encounter (Signed)
Patient is schedule 06/29/18 with RPH °

## 2018-06-29 ENCOUNTER — Encounter: Payer: Self-pay | Admitting: Obstetrics & Gynecology

## 2018-06-29 ENCOUNTER — Ambulatory Visit (INDEPENDENT_AMBULATORY_CARE_PROVIDER_SITE_OTHER): Payer: BLUE CROSS/BLUE SHIELD | Admitting: Obstetrics & Gynecology

## 2018-06-29 VITALS — BP 122/70 | Ht 64.0 in | Wt 194.0 lb

## 2018-06-29 DIAGNOSIS — N871 Moderate cervical dysplasia: Secondary | ICD-10-CM | POA: Insufficient documentation

## 2018-06-29 NOTE — Progress Notes (Signed)
   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  The indications for cryotherapy were reviewed with the patient in detail. She was counseled about that efficacy of this procedure, and possible need for excisional procedure in the future if her cervical dysplasia persists.  The risks of the procedure where explained in detail and patient was told to expect a copious amount of discharge in the next few weeks. All her questions were answered, and written informed consent was obtained.  The patient was placed in the dorsal lithotomy position and a vaginal speculum was placed. Her cervix was visualized and patient was noted to have had normal size transformation zone. The appropriate cryotherapy probes were picked and the more endocervical probe was then affixed to cryotherapy apparatus. Then, nitrogen gas was then activated, the probe was applied to the transformation zone of the cervix. This was kept in place for 2 minutes. The cryotherapy was then stopped and all instruments were removed from the patient's pelvis; a thawing period of 2 minutes was observed.  A second cycle of cryotherapy was then administered to the cervix with the more ectocervical probe for 2 minutes.  Then, the probe was removed.  The patient tolerated the procedure well without any complications. Routine post procedure instructions were given to the patient.  Will repeat pap smear in 6 months and manage accordingly.  Annamarie MajorPaul Cato Liburd, MD, [facility]AC[facility], [facility] 06/29/2018  9:51 AM

## 2018-06-29 NOTE — Patient Instructions (Signed)
Cryoablation °Cryoablation is a procedure used to remove abnormal growths or cancerous tissue. This is done by freezing the growth or tissue with either liquid nitrogen or argon gas. This procedure may be done to treat many conditions, including: °· Skin tumors. °· Non-cancerous (benign) nodules. °· A type of eye cancer (retinoblastoma). °· Cancers of the prostate, liver, kidney, cervix, lung, and bone. ° °Tell a health care provider about: °· Any allergies you have. °· All medicines you are taking, including vitamins, herbs, eye drops, creams, and over-the-counter medicines. °· Any problems you or family members have had with anesthetic medicines. °· Any blood disorders you have. °· Any surgeries you have had. °· Any medical conditions you have. °· Whether you are pregnant or may be pregnant. °What are the risks? °Generally, this is a safe procedure. However, problems may occur, including: °· Infection. °· Bleeding. °· Swelling. °· Nerve damage and loss of feeling. This is rare. °· Allergic reactions to medicines. °· Damage to other structures or organs. ° °What happens before the procedure? °Staying hydrated °Follow instructions from your health care provider about hydration, which may include: °· Up to 2 hours before the procedure - you may continue to drink clear liquids, such as water, clear fruit juice, black coffee, and plain tea. ° °Eating and drinking restrictions °Follow instructions from your health care provider about eating and drinking, which may include: °· 8 hours before the procedure - stop eating heavy meals or foods such as meat, fried foods, or fatty foods. °· 6 hours before the procedure - stop eating light meals or foods, such as toast or cereal. °· 6 hours before the procedure - stop drinking milk or drinks that contain milk. °· 2 hours before the procedure - stop drinking clear liquids. ° °Medicines °· Ask your health care provider about: °? Changing or stopping your regular medicines. This  is especially important if you are taking diabetes medicines or blood thinners. °? Taking medicines such as aspirin and ibuprofen. These medicines can thin your blood. Do not take these medicines before your procedure if your health care provider instructs you not to. °· You may be given antibiotic medicine to help prevent infection. °General instructions °· You may have blood tests. °· Plan to have someone take you home from the hospital or clinic. °· If you will be going home right after the procedure, plan to have someone with you for 24 hours. °· Ask your health care provider how your surgical site will be marked or identified. °What happens during the procedure? °· To reduce your risk of infection: °? Your health care team will wash or sanitize their hands. °? Your skin will be washed with soap. °? Hair may be removed from the surgical area. °· An IV tube will be inserted into one of your veins. °· You will be given one or more of the following: °? A medicine to help you relax (sedative). °? A medicine to numb the area (local anesthetic). °? A medicine to make you fall asleep (general anesthetic). °? A medicine that is injected into your spine to numb the area below and slightly above the injection site (spinal anesthetic). °? A medicine that is injected into an area of your body to numb everything below the injection site (regional anesthetic). °· Depending on the location of the growth, a small incision may be made. A bronchoscope may be used for a lung tumor, or a laparoscope may be used for an abdominal tumor. °· Your   health care provider may use a device (cryoprobe) that has liquid nitrogen or argon gas flowing through it. The cryoprobe will be inserted through the incision to the area where the tumor is located. This may be done with guidance from imaging such as an ultrasound, CT scan, or MRI scan. °· Liquid nitrogen or argon gas will be delivered through the cryoprobe to the growth until it is frozen  and destroyed. °· The process may be repeated on other areas depending on how many areas need treatment. °· The cryoprobe will be removed and pressure will be applied to stop any bleeding. °· The incision will be closed with stitches (sutures). °· The incision will be covered with a bandage (dressing). °The procedure will vary depending on the location of the tumor or nodule. The procedure may also vary among health care providers and hospitals. °What happens after the procedure? °· Do not drive for 24 hours if you received a sedative. °· Your blood pressure, heart rate, breathing rate, and blood oxygen level will be monitored until the medicines you were given have worn off. °· You will be given medicine to help with pain, nausea, and vomiting as needed. °This information is not intended to replace advice given to you by your health care provider. Make sure you discuss any questions you have with your health care provider. °Document Released: 05/12/2013 Document Revised: 02/09/2016 Document Reviewed: 12/20/2015 °Elsevier Interactive Patient Education © 2018 [employer] ° °

## 2018-10-01 ENCOUNTER — Ambulatory Visit (INDEPENDENT_AMBULATORY_CARE_PROVIDER_SITE_OTHER): Payer: BLUE CROSS/BLUE SHIELD | Admitting: Obstetrics & Gynecology

## 2018-10-01 ENCOUNTER — Other Ambulatory Visit (HOSPITAL_COMMUNITY)
Admission: RE | Admit: 2018-10-01 | Discharge: 2018-10-01 | Disposition: A | Discharge status: Home / Self Care | Payer: BLUE CROSS/BLUE SHIELD | Source: Ambulatory Visit | Attending: Obstetrics & Gynecology | Admitting: Obstetrics & Gynecology

## 2018-10-01 ENCOUNTER — Encounter: Payer: Self-pay | Admitting: Obstetrics & Gynecology

## 2018-10-01 VITALS — BP 138/80 | Ht 64.0 in | Wt 194.0 lb

## 2018-10-01 DIAGNOSIS — N871 Moderate cervical dysplasia: Secondary | ICD-10-CM

## 2018-10-01 NOTE — Progress Notes (Signed)
  HPI:  Patient is a 33 y.o. [id number] presenting for follow up evaluation of abnormal PAP smear in the past. 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  PMHx: She  has a past medical history of Anxiety. Also,  has a past surgical history that includes Tubal ligation (12/2008) and Cesarean section (2008)., family history includes Diabetes in her father, maternal grandmother, paternal grandmother, and another family member; Heart attack in her paternal grandfather; Hypertension in her mother, paternal grandmother, and another family member.,  reports that she has never smoked. She has never used smokeless tobacco. She reports current alcohol use. She reports that she does not use drugs.  She has a current medication list which includes the following prescription(s): ferrous sulfate, norgestimate-ethinyl estradiol, pimecrolimus, tobramycin-dexamethasone, and triamcinolone cream. Also, has No Known Allergies.  Review of Systems  All other systems reviewed and are negative.  Objective: BP 138/80   Ht 5\' 4"  (1.626 m)   Wt 194 lb (88 kg)   LMP 09/08/2018   BMI 33.30 kg/m  Filed Weights   10/01/18 0759  Weight: 194 lb (88 kg)   Body mass index is 33.3 kg/m.  Physical examination Physical Exam Constitutional:      General: She is not in acute distress.    Appearance: She is well-developed.  Genitourinary:     Pelvic exam was performed with patient supine.     Vagina and uterus normal.     No vaginal erythema or bleeding.     No cervical motion tenderness, discharge, polyp or nabothian cyst.     Uterus is mobile.     Uterus is not enlarged.     No uterine mass detected.    Uterus is midaxial.     No right or left adnexal mass present.     Right adnexa not tender.     Left adnexa not tender.  HENT:     Head:  Normocephalic and atraumatic.     Nose: Nose normal.  Abdominal:     General: There is no distension.     Palpations: Abdomen is soft.     Tenderness: There is no abdominal tenderness.  Musculoskeletal: Normal range of motion.  Neurological:     Mental Status: She is alert and oriented to person, place, and time.     Cranial Nerves: No cranial nerve deficit.  Skin:    General: Skin is warm and dry.   ASSESSMENT:  History of Cervical Dysplasia Prior CIN II S/p Cryo 06/2018  Plan:  1.  I discussed the grading system of pap smears and HPV high risk viral types.   2. Follow up PAP 6 months, vs intervention if high grade dysplasia identified.  A total of 15 minutes were spent face-to-face with the patient during this encounter and over half of that time dealt with counseling and coordination of care.  Annamarie Major, MD, [facility], [facility] 10/01/2018  8:04 AM

## 2018-10-05 LAB — CYTOLOGY - PAP: DIAGNOSIS: NEGATIVE

## 2018-12-07 ENCOUNTER — Ambulatory Visit: Payer: BLUE CROSS/BLUE SHIELD | Admitting: Obstetrics & Gynecology

## 2019-03-31 ENCOUNTER — Other Ambulatory Visit: Payer: Self-pay

## 2019-03-31 ENCOUNTER — Ambulatory Visit (INDEPENDENT_AMBULATORY_CARE_PROVIDER_SITE_OTHER): Payer: BC Managed Care – PPO | Admitting: Obstetrics & Gynecology

## 2019-03-31 ENCOUNTER — Other Ambulatory Visit (HOSPITAL_COMMUNITY)
Admission: RE | Admit: 2019-03-31 | Discharge: 2019-03-31 | Disposition: A | Discharge status: Home / Self Care | Payer: BC Managed Care – PPO | Source: Ambulatory Visit | Attending: Obstetrics & Gynecology | Admitting: Obstetrics & Gynecology

## 2019-03-31 ENCOUNTER — Encounter: Payer: Self-pay | Admitting: Obstetrics & Gynecology

## 2019-03-31 VITALS — BP 130/80 | Ht 64.0 in | Wt 195.0 lb

## 2019-03-31 DIAGNOSIS — N871 Moderate cervical dysplasia: Secondary | ICD-10-CM

## 2019-03-31 NOTE — Progress Notes (Signed)
  HPI:  Patient is a 33 y.o. [id number] presenting for follow up evaluation of abnormal PAP smear in the past.  Her last PAP was 6 months ago and was normal and prior PAP was LGSIL; she has h/o CIN II treated by Cryo in 2019.   Periods reg with help of POP, prior BTL.  PMHx: She  has a past medical history of Anxiety. Also,  has a past surgical history that includes Tubal ligation (12/2008) and Cesarean section (2008)., family history includes Diabetes in her father, maternal grandmother, paternal grandmother, and another family member; Heart attack in her paternal grandfather; Hypertension in her mother, paternal grandmother, and another family member.,  reports that she has never smoked. She has never used smokeless tobacco. She reports current alcohol use. She reports that she does not use drugs.  She has a current medication list which includes the following prescription(s): norgestimate-ethinyl estradiol, ferrous sulfate, pimecrolimus, and triamcinolone cream. Also, has No Known Allergies.  Review of Systems  All other systems reviewed and are negative.   Objective: BP 130/80   Ht 5\' 4"  (1.626 m)   Wt 195 lb (88.5 kg)   LMP 03/11/2019   BMI 33.47 kg/m  Filed Weights   03/31/19 0807  Weight: 195 lb (88.5 kg)   Body mass index is 33.47 kg/m.  Physical examination Physical Exam Constitutional:      General: She is not in acute distress.    Appearance: She is well-developed.  Genitourinary:     Pelvic exam was performed with patient supine.     Vagina and uterus normal.     No vaginal erythema or bleeding.     No cervical motion tenderness, discharge, polyp or nabothian cyst.     Uterus is mobile.     Uterus is not enlarged.     No uterine mass detected.    Uterus is midaxial.     No right or left adnexal mass present.     Right adnexa not tender.     Left adnexa not tender.  HENT:     Head: Normocephalic and atraumatic.     Nose: Nose normal.  Abdominal:     General:  There is no distension.     Palpations: Abdomen is soft.     Tenderness: There is no abdominal tenderness.  Musculoskeletal: Normal range of motion.  Neurological:     Mental Status: She is alert and oriented to person, place, and time.     Cranial Nerves: No cranial nerve deficit.  Skin:    General: Skin is warm and dry.     ASSESSMENT:  History of Cervical Dysplasia   ICD-10-CM   1. CIN II (cervical intraepithelial neoplasia II)  N87.1 Cytology - PAP    Plan:  1.  I discussed the grading system of pap smears and HPV high risk viral types.   2. Follow up PAP 6 months, vs intervention if high grade dysplasia identified.  Barnett Applebaum, MD, [facility], [facility] 03/31/2019  8:21 AM

## 2019-04-02 LAB — CYTOLOGY - PAP
Adequacy: ABSENT
Diagnosis: NEGATIVE

## 2019-05-25 ENCOUNTER — Ambulatory Visit (INDEPENDENT_AMBULATORY_CARE_PROVIDER_SITE_OTHER): Payer: BC Managed Care – PPO | Admitting: Family Medicine

## 2019-05-25 ENCOUNTER — Other Ambulatory Visit: Payer: Self-pay

## 2019-05-25 ENCOUNTER — Encounter: Payer: Self-pay | Admitting: Family Medicine

## 2019-05-25 VITALS — BP 128/74 | HR 96 | Temp 96.9°F | Resp 16 | Ht 64.0 in | Wt 192.7 lb

## 2019-05-25 DIAGNOSIS — Z130 Encounter for screening for diseases of the blood and blood-forming organs and certain disorders involving the immune mechanism: Secondary | ICD-10-CM | POA: Diagnosis not present

## 2019-05-25 DIAGNOSIS — Z131 Encounter for screening for diabetes mellitus: Secondary | ICD-10-CM | POA: Diagnosis not present

## 2019-05-25 DIAGNOSIS — L280 Lichen simplex chronicus: Secondary | ICD-10-CM

## 2019-05-25 DIAGNOSIS — Z0000 Encounter for general adult medical examination without abnormal findings: Secondary | ICD-10-CM | POA: Diagnosis not present

## 2019-05-25 DIAGNOSIS — Z13220 Encounter for screening for lipoid disorders: Secondary | ICD-10-CM

## 2019-05-25 DIAGNOSIS — Z23 Encounter for immunization: Secondary | ICD-10-CM | POA: Diagnosis not present

## 2019-05-25 DIAGNOSIS — Z803 Family history of malignant neoplasm of breast: Secondary | ICD-10-CM

## 2019-05-25 DIAGNOSIS — L308 Other specified dermatitis: Secondary | ICD-10-CM

## 2019-05-25 MED ORDER — TRIAMCINOLONE ACETONIDE 0.1 % EX CREA
1.0000 | TOPICAL_CREAM | Freq: Two times a day (BID) | CUTANEOUS | 0 refills | Status: DC
Start: 2019-05-25 — End: 2020-04-13

## 2019-05-25 NOTE — Progress Notes (Signed)
Name: Carla Cox   MRN: [id number]    DOB: 01/07/1986   Date:05/25/2019       Progress Note  Subjective  Chief Complaint  Chief Complaint  Patient presents with  . Well woman exam    HPI  Patient presents for annual CPE.  Eczema not better: tried triamcinolone but does not work well, Elidel is too costly and also did not seem to improve symptoms, mostly on neck and popliteal fossa. It is itchy at times. Worse during the winter months. She has never seen dermatologist but is willing to go now   Obesity: BMI is 33, no other risk factors, discussed BMI of 29.9 is a weight of 174 lbs. She states her goal is around 165 lbs. Discussed carbohydrate restrictive diet and also intermittent fasting   Diet: balance, not enough calcium, drinks mostly water. Does not eat out on a regular basis  Exercise: doing cardio work outs at home  USPSTF grade A and B recommendations    Office Visit from 05/25/2019 in [facility]  AUDIT-C Score  1     Depression: Phq 9 is  negative Depression screen PHQ 2/9 05/25/2019 05/21/2018 03/25/2018  Decreased Interest 0 0 0  Down, Depressed, Hopeless 0 0 0  PHQ - 2 Score 0 0 0  Altered sleeping 0 0 0  Tired, decreased energy 0 0 1  Change in appetite 0 0 0  Feeling bad or failure about yourself  0 0 0  Trouble concentrating 1 0 0  Moving slowly or fidgety/restless 0 0 0  Suicidal thoughts 0 0 0  PHQ-9 Score 1 0 1  Difficult doing work/chores Not difficult at all - Not difficult at all   Hypertension: BP Readings from Last 3 Encounters:  05/25/19 128/74  03/31/19 130/80  10/01/18 138/80   Obesity: Wt Readings from Last 3 Encounters:  05/25/19 192 lb 11.2 oz (87.4 kg)  03/31/19 195 lb (88.5 kg)  10/01/18 194 lb (88 kg)   BMI Readings from Last 3 Encounters:  05/25/19 33.08 kg/m  03/31/19 33.47 kg/m  10/01/18 33.30 kg/m     Hep C Screening: 03/2018  STD testing and prevention (HIV/chl/gon/syphilis): not interested   Intimate partner violence: negative screen  Sexual History/Pain during Intercourse: no pain during intercourse - same person - significant other past 4 years  Menstrual History/LMP/Abnormal Bleeding: on ocp, regular cycles, and not as heavy since on ocp Incontinence Symptoms: no symptoms   Breast cancer:  - Last Mammogram: start now because of family history  - BRCA gene screening: both side of family, aunts and cousins. Discussed genetic testing and she will think about it   Osteoporosis Prevention: discussed high calcium and vitamin D diet   Cervical cancer screening: done 03/2019 by Dr. Harris   Skin cancer: discussed atypical lesions Colorectal cancer: not indicated    Advanced Care Planning: A voluntary discussion about advance care planning including the explanation and discussion of advance directives.  Discussed health care proxy and Living will, and the patient was able to identify a health care proxy as fiance Shon  .  Patient does not have a living will at present time.   Lipids: Lab Results  Component Value Date   CHOL 108 03/25/2018   Lab Results  Component Value Date   HDL 60 03/25/2018   Lab Results  Component Value Date   LDLCALC 34 03/25/2018   Lab Results  Component Value Date   TRIG 62 03/25/2018     Lab Results  Component Value Date   CHOLHDL 1.8 03/25/2018   No results found for: LDLDIRECT  Glucose: Glucose, Bld  Date Value Ref Range Status  03/25/2018 83 65 - 99 mg/dL Final    Comment:    .            Fasting reference interval .   08/06/2011 88 70 - 99 mg/dL Final  05/08/2008 100 (H)  Final    Patient Active Problem List   Diagnosis Date Noted  . CIN II (cervical intraepithelial neoplasia II) 06/29/2018  . 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 Iron deficiency anemia 03/31/2018  . Obesity (BMI 30.0-34.9) 03/25/2018  . Eczema 03/25/2018    Past Surgical History:  Procedure Laterality  Date  . CESAREAN SECTION  2008   c-section  . TUBAL LIGATION  12/2008    Family History  Problem Relation Age of Onset  . Hypertension Mother   . Diabetes Father   . Hypertension Other   . Diabetes Other   . Diabetes Maternal Grandmother   . Hypertension Paternal Grandmother   . Diabetes Paternal Grandmother   . Heart attack Paternal Grandfather     Social History   Socioeconomic History  . Marital status: Divorced    Spouse name: Not on file  . Number of children: 2  . Years of education: Not on file  . Highest education level: Bachelor's degree (e.g., BA, AB, BS)  Occupational History  . Occupation: accountant   Social Needs  . Financial resource strain: Not hard at all  . Food insecurity    Worry: Never true    Inability: Never true  . Transportation needs    Medical: No    Non-medical: No  Tobacco Use  . Smoking status: Never Smoker  . Smokeless tobacco: Never Used  Substance and Sexual Activity  . Alcohol use: Yes    Comment: occasionally  . Drug use: No  . Sexual activity: Yes    Partners: Male    Birth control/protection: Other-see comments    Comment: Tubal Ligation  Lifestyle  . Physical activity    Days per week: 3 days    Minutes per session: 40 min  . Stress: Only a little  Relationships  . Social connections    Talks on phone: More than three times a week    Gets together: Once a week    Attends religious service: Never    Active member of club or organization: No    Attends meetings of clubs or organizations: Never    Relationship status: Living with partner  . Intimate partner violence    Fear of current or ex partner: No    Emotionally abused: No    Physically abused: No    Forced sexual activity: No  Other Topics Concern  . Not on file  Social History Narrative   Divorced since age 27 , has two sons - they live with father in [place] - better school district, but spend Summers and weekends with mom       She lives with fiance  since 2016     Current Outpatient Medications:  .  norgestimate-ethinyl estradiol (SPRINTEC 28) 0.25-35 MG-MCG tablet, Take 1 tablet by mouth daily., Disp: 1 Package, Rfl: 11 .  ferrous sulfate 325 (65 FE) MG tablet, Take 1 tablet (325 mg total) by mouth 2 (two) times daily with a meal. (Patient not taking: Reported on 03/31/2019), Disp: 60 tablet, Rfl:   2 .  pimecrolimus (ELIDEL) 1 % cream, Apply topically 2 (two) times daily. (Patient not taking: Reported on 03/31/2019), Disp: 30 g, Rfl: 0 .  triamcinolone cream (KENALOG) 0.1 %, Apply 1 application topically 2 (two) times daily. (Patient not taking: Reported on 03/31/2019), Disp: 30 g, Rfl: 0  No Known Allergies   ROS  Constitutional: Negative for fever or weight change.  Respiratory: Negative for cough and shortness of breath.   Cardiovascular: Negative for chest pain or palpitations.  Gastrointestinal: Negative for abdominal pain, no bowel changes.  Musculoskeletal: Negative for gait problem or joint swelling.  Skin: positive  for rash.  Neurological: Negative for dizziness or headache.  No other specific complaints in a complete review of systems (except as listed in HPI above).  Objective  Vitals:   05/25/19 0816  BP: 128/74  Pulse: 96  Resp: 16  Temp: (!) 96.9 F (36.1 C)  TempSrc: Temporal  SpO2: 96%  Weight: 192 lb 11.2 oz (87.4 kg)  Height: 5' 4" (1.626 m)    Body mass index is 33.08 kg/m.  Physical Exam  Constitutional: Patient appears well-developed and well-nourished. No distress.  HENT: Head: Normocephalic and atraumatic. Ears: B TMs ok, no erythema or effusion; Nose: Nose normal. Mouth/Throat: Oropharynx is clear and moist. No oropharyngeal exudate.  Eyes: Conjunctivae and EOM are normal. Pupils are equal, round, and reactive to light. No scleral icterus.  Neck: Normal range of motion. Neck supple. No JVD present. No thyromegaly present.  Cardiovascular: Normal rate, regular rhythm and normal heart sounds.   No murmur heard. No BLE edema. Pulmonary/Chest: Effort normal and breath sounds normal. No respiratory distress. Abdominal: Soft. Bowel sounds are normal, no distension. There is no tenderness. no masses Breast: no lumps or masses, no nipple discharge or rashes FEMALE GENITALIA:  External genitalia normal External urethra normal Vaginal vault normal without discharge or lesions Cervix normal without discharge or lesions Bimanual exam normal without masses RECTAL: not done Musculoskeletal: Normal range of motion, no joint effusions. No gross deformities Neurological: he is alert and oriented to person, place, and time. No cranial nerve deficit. Coordination, balance, strength, speech and gait are normal.  Skin: Skin is warm and dry. No rash noted. No erythema.  Psychiatric: Patient has a normal mood and affect. behavior is normal. Judgment and thought content normal.  Recent Results (from the past 2160 hour(s))  Cytology - PAP     Status: None   Collection Time: 03/31/19 12:00 AM  Result Value Ref Range   Adequacy      Satisfactory for evaluation  endocervical/transformation zone component ABSENT.   Diagnosis      NEGATIVE FOR INTRAEPITHELIAL LESIONS OR MALIGNANCY.   Diagnosis      FUNGAL ORGANISMS PRESENT CONSISTENT WITH CANDIDA SPP.   Diagnosis SHIFT IN FLORA SUGGESTIVE OF BACTERIAL VAGINOSIS.    Material Submitted CervicoVaginal Pap [ThinPrep Imaged]      Fall Risk: Fall Risk  05/25/2019 05/21/2018 03/25/2018 03/25/2018  Falls in the past year? 0 No No No  Number falls in past yr: 0 - - -  Injury with Fall? 0 - - -     Functional Status Survey: Is the patient deaf or have difficulty hearing?: No Does the patient have difficulty seeing, even when wearing glasses/contacts?: No Does the patient have difficulty concentrating, remembering, or making decisions?: No Does the patient have difficulty dressing or bathing?: No Does the patient have difficulty doing errands alone such  as visiting a doctor's office or shopping?:   No   Assessment & Plan  1. Well adult exam  - COMPLETE METABOLIC PANEL WITH GFR  2. Needs flu shot  Refused   3. Diabetes mellitus screening  - Hemoglobin A1c  4. Screening, lipid  - Lipid panel  5. Screening, iron deficiency anemia  - CBC with Differential/Platelet  6. Lichen simplex, chronic  - Ambulatory referral to Dermatology - triamcinolone cream (KENALOG) 0.1 %; Apply 1 application topically 2 (two) times daily.  Dispense: 453.6 g; Refill: 0  7. Other eczema  - Ambulatory referral to Dermatology - triamcinolone cream (KENALOG) 0.1 %; Apply 1 application topically 2 (two) times daily.  Dispense: 453.6 g; Refill: 0  8. Family history of breast cancer  - MM Digital Screening; Future  -USPSTF grade A and B recommendations reviewed with patient; age-appropriate recommendations, preventive care, screening tests, etc discussed and encouraged; healthy living encouraged; see AVS for patient education given to patient -Discussed importance of 150 minutes of physical activity weekly, eat two servings of fish weekly, eat one serving of tree nuts ( cashews, pistachios, pecans, almonds..) every other day, eat 6 servings of fruit/vegetables daily and drink plenty of water and avoid sweet beverages.    

## 2019-05-25 NOTE — Patient Instructions (Signed)
Preventive Care 21-35 Years Old, Female Preventive care refers to visits with your health care provider and lifestyle choices that can promote health and wellness. This includes:  A yearly physical exam. This may also be called an annual well check.  Regular dental visits and eye exams.  Immunizations.  Screening for certain conditions.  Healthy lifestyle choices, such as eating a healthy diet, getting regular exercise, not using drugs or products that contain nicotine and tobacco, and limiting alcohol use. What can I expect for my preventive care visit? Physical exam Your health care provider will check your:  Height and weight. This may be used to calculate body mass index (BMI), which tells if you are at a healthy weight.  Heart rate and blood pressure.  Skin for abnormal spots. Counseling Your health care provider may ask you questions about your:  Alcohol, tobacco, and drug use.  Emotional well-being.  Home and relationship well-being.  Sexual activity.  Eating habits.  Work and work environment.  Method of birth control.  Menstrual cycle.  Pregnancy history. What immunizations do I need?  Influenza (flu) vaccine  This is recommended every year. Tetanus, diphtheria, and pertussis (Tdap) vaccine  You may need a Td booster every 10 years. Varicella (chickenpox) vaccine  You may need this if you have not been vaccinated. Human papillomavirus (HPV) vaccine  If recommended by your health care provider, you may need three doses over 6 months. Measles, mumps, and rubella (MMR) vaccine  You may need at least one dose of MMR. You may also need a second dose. Meningococcal conjugate (MenACWY) vaccine  One dose is recommended if you are age 19-21 years and a first-year college student living in a residence hall, or if you have one of several medical conditions. You may also need additional booster doses. Pneumococcal conjugate (PCV13) vaccine  You may need  this if you have certain conditions and were not previously vaccinated. Pneumococcal polysaccharide (PPSV23) vaccine  You may need one or two doses if you smoke cigarettes or if you have certain conditions. Hepatitis A vaccine  You may need this if you have certain conditions or if you travel or work in places where you may be exposed to hepatitis A. Hepatitis B vaccine  You may need this if you have certain conditions or if you travel or work in places where you may be exposed to hepatitis B. Haemophilus influenzae type b (Hib) vaccine  You may need this if you have certain conditions. You may receive vaccines as individual doses or as more than one vaccine together in one shot (combination vaccines). Talk with your health care provider about the risks and benefits of combination vaccines. What tests do I need?  Blood tests  Lipid and cholesterol levels. These may be checked every 5 years starting at age 20.  Hepatitis C test.  Hepatitis B test. Screening  Diabetes screening. This is done by checking your blood sugar (glucose) after you have not eaten for a while (fasting).  Sexually transmitted disease (STD) testing.  BRCA-related cancer screening. This may be done if you have a family history of breast, ovarian, tubal, or peritoneal cancers.  Pelvic exam and Pap test. This may be done every 3 years starting at age 21. Starting at age 30, this may be done every 5 years if you have a Pap test in combination with an HPV test. Talk with your health care provider about your test results, treatment options, and if necessary, the need for more tests.   Follow these instructions at home: Eating and drinking   Eat a diet that includes fresh fruits and vegetables, whole grains, lean protein, and low-fat dairy.  Take vitamin and mineral supplements as recommended by your health care provider.  Do not drink alcohol if: ? Your health care provider tells you not to drink. ? You are  pregnant, may be pregnant, or are planning to become pregnant.  If you drink alcohol: ? Limit how much you have to 0-1 drink a day. ? Be aware of how much alcohol is in your drink. In the [place] one drink equals one 12 oz bottle of beer (355 mL), one 5 oz glass of wine (148 mL), or one 1 oz glass of hard liquor (44 mL). Lifestyle  Take daily care of your teeth and gums.  Stay active. Exercise for at least 30 minutes on 5 or more days each week.  Do not use any products that contain nicotine or tobacco, such as cigarettes, e-cigarettes, and chewing tobacco. If you need help quitting, ask your health care provider.  If you are sexually active, practice safe sex. Use a condom or other form of birth control (contraception) in order to prevent pregnancy and STIs (sexually transmitted infections). If you plan to become pregnant, see your health care provider for a preconception visit. What's next?  Visit your health care provider once a year for a well check visit.  Ask your health care provider how often you should have your eyes and teeth checked.  Stay up to date on all vaccines. This information is not intended to replace advice given to you by your health care provider. Make sure you discuss any questions you have with your health care provider. Document Released: 09/17/2001 Document Revised: 04/02/2018 Document Reviewed: 04/02/2018 Elsevier Patient Education  2020 Elsevier Inc.  

## 2019-05-26 LAB — CBC WITH DIFFERENTIAL/PLATELET
Absolute Monocytes: 409 cells/uL (ref 200–950)
Basophils Absolute: 44 cells/uL (ref 0–200)
Basophils Relative: 0.5 %
Eosinophils Absolute: 113 cells/uL (ref 15–500)
Eosinophils Relative: 1.3 %
HCT: 35 % (ref 35.0–45.0)
Hemoglobin: 11.5 g/dL — ABNORMAL LOW (ref 11.7–15.5)
Lymphs Abs: 1766 cells/uL (ref 850–3900)
MCH: 28.8 pg (ref 27.0–33.0)
MCHC: 32.9 g/dL (ref 32.0–36.0)
MCV: 87.7 fL (ref 80.0–100.0)
MPV: 10.5 fL (ref 7.5–12.5)
Monocytes Relative: 4.7 %
Neutro Abs: 6368 cells/uL (ref 1500–7800)
Neutrophils Relative %: 73.2 %
Platelets: 355 10*3/uL (ref 140–400)
RBC: 3.99 10*6/uL (ref 3.80–5.10)
RDW: 13.4 % (ref 11.0–15.0)
Total Lymphocyte: 20.3 %
WBC: 8.7 10*3/uL (ref 3.8–10.8)

## 2019-05-26 LAB — COMPLETE METABOLIC PANEL WITH GFR
AG Ratio: 1.5 (calc) (ref 1.0–2.5)
ALT: 13 U/L (ref 6–29)
AST: 15 U/L (ref 10–30)
Albumin: 4.3 g/dL (ref 3.6–5.1)
Alkaline phosphatase (APISO): 83 U/L (ref 31–125)
BUN: 7 mg/dL (ref 7–25)
CO2: 27 mmol/L (ref 20–32)
Calcium: 9.4 mg/dL (ref 8.6–10.2)
Chloride: 105 mmol/L (ref 98–110)
Creat: 0.82 mg/dL (ref 0.50–1.10)
GFR, Est African American: 107 mL/min/{1.73_m2} (ref >=60)
GFR, Est Non African American: 93 mL/min/{1.73_m2} (ref >=60)
Globulin: 2.9 g/dL (calc) (ref 1.9–3.7)
Glucose, Bld: 87 mg/dL (ref 65–99)
Potassium: 4.6 mmol/L (ref 3.5–5.3)
Sodium: 139 mmol/L (ref 135–146)
Total Bilirubin: 0.3 mg/dL (ref 0.2–1.2)
Total Protein: 7.2 g/dL (ref 6.1–8.1)

## 2019-05-26 LAB — LIPID PANEL
Cholesterol: 105 mg/dL (ref <200)
HDL: 57 mg/dL (ref >=50)
LDL Cholesterol (Calc): 35 mg/dL (calc)
Non-HDL Cholesterol (Calc): 48 mg/dL (calc) (ref <130)
Total CHOL/HDL Ratio: 1.8 (calc) (ref <5.0)
Triglycerides: 57 mg/dL (ref <150)

## 2019-05-26 LAB — HEMOGLOBIN A1C
Hgb A1c MFr Bld: 5.3 % of total Hgb (ref <5.7)
Mean Plasma Glucose: 105 (calc)
eAG (mmol/L): 5.8 (calc)

## 2019-06-23 DIAGNOSIS — L309 Dermatitis, unspecified: Secondary | ICD-10-CM | POA: Diagnosis not present

## 2019-06-23 DIAGNOSIS — B353 Tinea pedis: Secondary | ICD-10-CM | POA: Diagnosis not present

## 2019-09-10 ENCOUNTER — Ambulatory Visit: Payer: BC Managed Care – PPO | Admitting: Family Medicine

## 2019-10-22 ENCOUNTER — Ambulatory Visit: Payer: BC Managed Care – PPO | Attending: Internal Medicine

## 2019-10-22 ENCOUNTER — Other Ambulatory Visit: Payer: Self-pay

## 2019-10-22 DIAGNOSIS — Z23 Encounter for immunization: Secondary | ICD-10-CM

## 2019-10-22 NOTE — Progress Notes (Signed)
   Covid-19 Vaccination Clinic  Name:  Carla Cox    MRN: [id number] DOB: September 19, 1985  10/22/2019  [name] was observed post Covid-19 immunization for 15 minutes without incident. She was provided with Vaccine Information Sheet and instruction to access the V-Safe system.   [name] was instructed to call 911 with any severe reactions post vaccine: Marland Kitchen Difficulty breathing  . Swelling of face and throat  . A fast heartbeat  . A bad rash all over body  . Dizziness and weakness   Immunizations Administered    Name Date Dose VIS Date Route   Pfizer COVID-19 Vaccine 10/22/2019 11:16 AM 0.3 mL 07/16/2019 Intramuscular   Manufacturer: [employer], [employer]   Lot: [id number]   NDC: [address]-0

## 2019-11-16 ENCOUNTER — Ambulatory Visit: Payer: BC Managed Care – PPO | Attending: Internal Medicine

## 2019-11-16 DIAGNOSIS — Z23 Encounter for immunization: Secondary | ICD-10-CM

## 2019-11-16 NOTE — Progress Notes (Signed)
   Covid-19 Vaccination Clinic  Name:  Carla Cox    MRN: [id number] DOB: 1985-08-16  11/16/2019  [name] was observed post Covid-19 immunization for 15 minutes without incident. She was provided with Vaccine Information Sheet and instruction to access the V-Safe system.   [name] was instructed to call 911 with any severe reactions post vaccine: Marland Kitchen Difficulty breathing  . Swelling of face and throat  . A fast heartbeat  . A bad rash all over body  . Dizziness and weakness   Immunizations Administered    Name Date Dose VIS Date Route   Pfizer COVID-19 Vaccine 11/16/2019  4:16 PM 0.3 mL 07/16/2019 Intramuscular   Manufacturer: [employer], [employer]   Lot: [id number]   NDC: [address]-2

## 2020-04-13 ENCOUNTER — Other Ambulatory Visit (HOSPITAL_COMMUNITY)
Admission: RE | Admit: 2020-04-13 | Discharge: 2020-04-13 | Disposition: A | Discharge status: Home / Self Care | Payer: BC Managed Care – PPO | Source: Ambulatory Visit | Attending: Obstetrics & Gynecology | Admitting: Obstetrics & Gynecology

## 2020-04-13 ENCOUNTER — Other Ambulatory Visit: Payer: Self-pay

## 2020-04-13 ENCOUNTER — Encounter: Payer: Self-pay | Admitting: Obstetrics & Gynecology

## 2020-04-13 ENCOUNTER — Ambulatory Visit (INDEPENDENT_AMBULATORY_CARE_PROVIDER_SITE_OTHER): Payer: BC Managed Care – PPO | Admitting: Obstetrics & Gynecology

## 2020-04-13 VITALS — BP 130/80 | Ht 64.0 in | Wt 206.0 lb

## 2020-04-13 DIAGNOSIS — Z01419 Encounter for gynecological examination (general) (routine) without abnormal findings: Secondary | ICD-10-CM | POA: Diagnosis not present

## 2020-04-13 DIAGNOSIS — N920 Excessive and frequent menstruation with regular cycle: Secondary | ICD-10-CM

## 2020-04-13 DIAGNOSIS — D500 Iron deficiency anemia secondary to blood loss (chronic): Secondary | ICD-10-CM | POA: Diagnosis not present

## 2020-04-13 DIAGNOSIS — N871 Moderate cervical dysplasia: Secondary | ICD-10-CM | POA: Insufficient documentation

## 2020-04-13 MED ORDER — NORGESTIMATE-ETH ESTRADIOL 0.25-35 MG-MCG PO TABS: 1.0000 | ORAL_TABLET | Freq: Every day | ORAL | 3 refills | Status: AC

## 2020-04-13 NOTE — Progress Notes (Signed)
HPI:      [name] is a 34 y.o. [id number] who LMP was Patient's last menstrual period was 03/31/2020., she presents today for her annual examination. The patient has no complaints today. The patient is sexually active. Her last pap: approximate date 2020 and was normal and prior CIN 2 treated w [facility] 2019. The patient does perform self breast exams.  There is notable family history of breast or ovarian cancer in her family.  The patient has regular exercise: yes.  The patient denies current symptoms of depression.    GYN History: Contraception: tubal ligation  PMHx: Past Medical History:  Diagnosis Date  . Anxiety    Past Surgical History:  Procedure Laterality Date  . CESAREAN SECTION  2008   c-section  . TUBAL LIGATION  12/2008   Family History  Problem Relation Age of Onset  . Hypertension Mother   . Diabetes Father   . Hypertension Other   . Diabetes Other   . Diabetes Maternal Grandmother   . Hypertension Paternal Grandmother   . Diabetes Paternal Grandmother   . Heart attack Paternal Grandfather    Social History   Tobacco Use  . Smoking status: Never Smoker  . Smokeless tobacco: Never Used  Vaping Use  . Vaping Use: Never used  Substance Use Topics  . Alcohol use: Yes    Comment: occasionally  . Drug use: No    Current Outpatient Medications:  .  norgestimate-ethinyl estradiol (SPRINTEC 28) 0.25-35 MG-MCG tablet, Take 1 tablet by mouth daily., Disp: 84 tablet, Rfl: 3 Allergies: Patient has no known allergies.  Review of Systems  Constitutional: Negative for chills, fever and malaise/fatigue.  HENT: Negative for congestion, sinus pain and sore throat.   Eyes: Negative for blurred vision and pain.  Respiratory: Negative for cough and wheezing.   Cardiovascular: Negative for chest pain and leg swelling.  Gastrointestinal: Negative for abdominal pain, constipation, diarrhea, heartburn, nausea and vomiting.  Genitourinary: Negative for dysuria,  frequency, hematuria and urgency.  Musculoskeletal: Negative for back pain, joint pain, myalgias and neck pain.  Skin: Negative for itching and rash.  Neurological: Negative for dizziness, tremors and weakness.  Endo/Heme/Allergies: Does not bruise/bleed easily.  Psychiatric/Behavioral: Negative for depression. The patient is not nervous/anxious and does not have insomnia.     Objective: BP 130/80   Ht _0  (1.626 m)   Wt 206 lb (93.4 kg)   LMP 03/31/2020   BMI 35.36 kg/m   Filed Weights   04/13/20 1318  Weight: 206 lb (93.4 kg)   Body mass index is 35.36 kg/m. Physical Exam Constitutional:      General: She is not in acute distress.    Appearance: She is well-developed.  Genitourinary:     Pelvic exam was performed with patient supine.     Vagina and rectum normal.     No lesions in the vagina.     No vaginal bleeding.     No cervical motion tenderness, friability, lesion or polyp.     Uterus is enlarged and mobile.     No uterine mass detected.    Uterus is retroverted and globular.     No right or left adnexal mass present.     Right adnexa not tender.     Left adnexa not tender.     Genitourinary Comments: 6 week sized w possible fibroid  HENT:     Head: Normocephalic and atraumatic. No laceration.     Right Ear: Hearing  normal.     Left Ear: Hearing normal.     Mouth/Throat:     Pharynx: Uvula midline.  Eyes:     Pupils: Pupils are equal, round, and reactive to light.  Neck:     Thyroid: No thyromegaly.  Cardiovascular:     Rate and Rhythm: Normal rate and regular rhythm.     Heart sounds: No murmur heard.  No friction rub. No gallop.   Pulmonary:     Effort: Pulmonary effort is normal. No respiratory distress.     Breath sounds: Normal breath sounds. No wheezing.  Chest:     Breasts:        Right: No mass, skin change or tenderness.        Left: No mass, skin change or tenderness.  Abdominal:     General: Bowel sounds are normal. There is no  distension.     Palpations: Abdomen is soft.     Tenderness: There is no abdominal tenderness. There is no rebound.  Musculoskeletal:        General: Normal range of motion.     Cervical back: Normal range of motion and neck supple.  Neurological:     Mental Status: She is alert and oriented to person, place, and time.     Cranial Nerves: No cranial nerve deficit.  Skin:    General: Skin is warm and dry.  Psychiatric:        Judgment: Judgment normal.  Vitals reviewed.     Assessment:  ANNUAL EXAM 1. CIN II (cervical intraepithelial neoplasia II)   2. Women's annual routine gynecological examination   3. Iron deficiency anemia due to chronic blood loss   4. Menorrhagia with regular cycle      Screening Plan:            1.  Cervical Screening-  Pap smear done today  2. Breast screening- Exam annually and mammogram>40 planned   3. Colonoscopy every 10 years, Hemoccult testing - after age 39  4. Labs managed by PCP  5. Counseling for contraception: bilateral tubal ligation, takes hormones for period control  Other:  1. CIN II (cervical intraepithelial neoplasia II) - Cytology - PAP  2. Menorrhagia with regular cycle - All options dicussed - norgestimate-ethinyl estradiol (SPRINTEC 28) 0.25-35 MG-MCG tablet; Take 1 tablet by mouth daily.  Dispense: 84 tablet; Refill: 3  3.  She presents with a significant personal and/or family history of breast cancer Maternal Aunt age <35, 3 paternal cousins ages <54. Details of which can be found in her medical/family history. She does not have a previously identified BRCA and Lynch syndrome mutation in her family. Due to her personal and/or family history of cancer she is a candidate for the [facility] test(s).    Risk for cancer, genetic susceptibility discussed.  Patient has undecided gene testing.  Discussed BRCA as well as Lynch syndrome and other cancer risk assessments available based on her family history and personal history. Pros  and cons of testing discussed.       F/U  Return in about 1 year (around 04/13/2021) for Annual.  Barnett Applebaum, MD, [facility], [facility] 04/13/2020  1:57 PM

## 2020-04-13 NOTE — Patient Instructions (Signed)
Multigene Panel Testing for Cancer  What is cancer? Normal cells in the body grow, divide, and are replaced on a routine basis. Sometimes, cells divide abnormally and begin to grow out of control. These cells may form growths or tumors. Tumors can be benign (not cancer) or malignant (cancer). Benign tumors do not spread to other body tissues. Cancer tumors can invade and destroy nearby healthy tissues, bones, and organs. Cancer cells also can spread to other parts of the body and form new cancerous areas.  What causes cancer? Cancer is caused by many different factors. A few types of cancer are caused by changes in genes that can be passed from parent to child. Changes in genes are called mutations. Certain gene mutations are associated with family cancer syndromes. What are family cancer syndromes?  Family cancer syndromes are genetic conditions that increase the risk of certain types of cancer. They also are called hereditary or inherited cancer syndromes. Common family cancer syndromes include hereditary breast and ovarian cancer (HBOC) syndrome, Lynch syndrome, Li-Fraumeni syndrome, Cowden syndrome, and Peutz-Jeghers syndrome. What is genetic testing for cancer? Genetic testing for cancer looks for mutations in certain genes that are known to be linked to cancer. The results can help determine your risk of developing a disease like cancer or passing on a genetic disorder. What is multigene panel testing? Multigene panel testing is a type of genetic testing that looks for mutations in several genes at once. This is different from single-gene testing, which looks for a mutation in a specific gene. Single-gene testing is often used when there is already a known gene mutation in a family. For example, testing for BRCA mutations only looks for changes in BRCA1 and BRCA2 genes. Who should have genetic testing? You may consider genetic testing if your personal or family history shows that you have an  increased risk of cancer. Your obstetrician-gynecologist (ob-gyn) or other health care professional may ask you these and other questions: . Have you or any family members been diagnosed with cancer?  . If yes, which family members were diagnosed, with what types of cancer, and at what ages?  . Were you or any of your family members born with birth defects?  . Are you of Eastern or Central European Jewish ancestry? Depending on your answers, your ob-gyn or other health care professional may suggest that you talk about genetic testing with a genetic counselor or a physician who is an expert in genetics. You can choose to have genetic testing, or you can choose not to. Before you decide, you should have genetic counseling. What is genetic counseling? In genetic counseling, you will talk with a genetic counselor or physician expert about the following: . Your risk of getting a hereditary type of cancer  . Who in your family could potentially get tested  . How testing is done  . What the test results may mean  . What you may do depending on the results How is genetic testing done? Genetic testing typically is done from a blood sample or saliva sample. When is multigene panel testing recommended? Multigene panel testing may be useful if you . are at risk of a family cancer syndrome that has more than one gene associated with it  . have a personal or family history of cancer and single-gene testing has not found a mutation, or the result is uncertain What are the benefits of multigene panel testing? Multigene panel testing looks at multiple genes with one test. If a gene   mutation is found, multigene panel testing may . give you a better understanding of your cancer risk than single-gene testing  . help your health care team decide what cancer screenings you might need beyond routine screenings  . help you think about what you can do to prevent cancer What are the risks of multigene panel  testing? The risks of multigene panel testing may include the following: . Results can be complicated to interpret.  . Testing may find gene mutations that show a moderate or uncertain risk of cancer.  . It may be hard to know what you should do with your test results. You should talk with a genetic counselor or physician expert before and after genetic testing to learn what the results mean. If I have a gene mutation, should I tell my family? Having a gene mutation means you can pass the mutation to your children. Your siblings also may have the gene mutation. Although you do not have to tell your family members, sharing the information could be life-saving for them. With this information, your family members can decide whether to be tested and get cancer screenings at an early age. How can I prevent cancer if I test positive for a gene mutation? If you test positive for a gene mutation, you can discuss cancer screening and prevention options with your ob-gyn, genetic counselor, or other health care professional. It may be helpful to have earlier or more frequent cancer screening tests, which can find cancer at an early and more curable stage. Risk reduction steps like medication, surgery, and lifestyle changes also may be recommended. I'm concerned about discrimination based on genetic testing results. What should I know? Many people are concerned about possible employment discrimination or denial of insurance coverage based on genetic testing results. The Genetic Information Nondiscrimination Act of 2008 (GINA) makes it illegal for health insurers to require genetic testing results or use results to make decisions about coverage, rates, or preexisting conditions. GINA also makes it illegal for employers to discriminate against employees or applicants because of genetic information. GINA does not apply to life insurance, long-term care insurance, or disability insurance. What should I know about  direct-to-consumer genetic tests? Direct-to-consumer (or at-home) genetic tests are sold over the internet. You do not need a doctor's order to get one. The [employer] discourages use of direct-to-consumer genetic tests because the results may be misleading. For example, one commercial test for BRCA mutations only looks for three mutations, even though there are more than 500 BRCA mutations linked to cancer. The test results could cause unnecessary fear, or a false sense that you are not at risk. You should see a health care professional if you want a genetic test.  Glossary BRCA1 and BRCA2: Genes that keep cells from growing too rapidly. Changes in these genes have been linked to an increased risk of breast cancer and ovarian cancer.  Cowden Syndrome: A genetic condition that increases a person's risk of cancer of the breast, thyroid, uterus, colon, kidney, and skin. Genes: Segments of DNA that contain instructions for the development of a person's physical traits and control of the processes in the body. The gene is the basic unit of heredity and can be passed from parent to child. Genetic Counselor: A health care professional with special training in genetics who can provide expert advice about genetic disorders and prenatal testing. Hereditary Breast and Ovarian Cancer (HBOC) Syndrome: A genetic condition that increases a person's risk of cancer  of the breast, ovary, prostate, pancreas, and skin (melanoma). Li-Fraumeni Syndrome: A genetic condition that increases a person's risk of cancer of the breast, bones, soft tissue, brain, and outer layer of the adrenal glands. Lynch Syndrome: A genetic condition that increases a person's risk of cancer of the colon, rectum, ovary, uterus, pancreas, and bile duct. Multigene Panel Testing: A type of genetic test that can look for mutations in multiple genes at once. Mutations: Changes in genes that can be passed from  parent to child. Obstetrician-Gynecologist (Ob-Gyn): A doctor with special training and education in women's health. Peutz-Jeghers Syndrome: A genetic condition that increases a person's risk of cancer of the stomach, intestines, pancreas, cervix, ovary, and breast.  Thank you for choosing [facility]. As part of our ongoing efforts to improve patient experience, we would appreciate your feedback. Please fill out the short survey that you will receive by mail or MyChart. Your opinion is important to [place]! -Dr Kenton Kingfisher

## 2020-04-17 LAB — CYTOLOGY - PAP
Adequacy: ABSENT
Diagnosis: NEGATIVE

## 2020-04-18 ENCOUNTER — Other Ambulatory Visit: Payer: Self-pay | Admitting: Obstetrics & Gynecology

## 2020-04-18 MED ORDER — FLUCONAZOLE 150 MG PO TABS: 150.0000 mg | ORAL_TABLET | Freq: Once | ORAL | 3 refills | Status: AC

## 2020-04-18 MED ORDER — METRONIDAZOLE 0.75 % VA GEL: 1.0000 | Freq: Every day | VAGINAL | 0 refills | Status: AC

## 2020-05-24 NOTE — Progress Notes (Signed)
Name: Carla Cox   MRN: [id number]    DOB: 23-Aug-1985   Date:05/25/2020       Progress Note  Subjective  Chief Complaint  Annual Exam   HPI  Patient presents for annual CPE.  Diet: she states high in carbohydrates, she has been avoiding fried food, eats at home, she does not have enough calcium in her diet. She likes fruit and vegetable.  Exercise: not enough per week, discussed 150 minutes per week     Office Visit from 05/25/2020 in [facility]  AUDIT-C Score 2     Depression: Phq 9 is  positive Depression screen [facility] 2/9 05/25/2020 05/25/2019 05/21/2018 03/25/2018  Decreased Interest 1 0 0 0  Down, Depressed, Hopeless 0 0 0 0  PHQ - 2 Score 1 0 0 0  Altered sleeping 0 0 0 0  Tired, decreased energy 1 0 0 1  Change in appetite 0 0 0 0  Feeling bad or failure about yourself  0 0 0 0  Trouble concentrating 0 1 0 0  Moving slowly or fidgety/restless 0 0 0 0  Suicidal thoughts 0 0 0 0  PHQ-9 Score 2 1 0 1  Difficult doing work/chores Not difficult at all Not difficult at all - Not difficult at all   Hypertension: BP Readings from Last 3 Encounters:  05/25/20 120/70  04/13/20 130/80  05/25/19 128/74   Obesity: Wt Readings from Last 3 Encounters:  05/25/20 202 lb 1.6 oz (91.7 kg)  04/13/20 206 lb (93.4 kg)  05/25/19 192 lb 11.2 oz (87.4 kg)   BMI Readings from Last 3 Encounters:  05/25/20 34.69 kg/m  04/13/20 35.36 kg/m  05/25/19 33.08 kg/m     Vaccines:  HPV: up to at age 13 , ask insurance if age between 22-45  Flu: educated and discussed with patient.  Hep C Screening: 03/2018  STD testing and prevention (HIV/chl/gon/syphilis): not interested  Intimate partner violence:No Sexual History : sexually active, one partner, no pain or bleeding post-coital  Menstrual History/LMP/Abnormal Bleeding: heavy cycles, gets pap done by Dr. 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, possible fibroid  Incontinence Symptoms:   Breast cancer:  -  Last Mammogram: never had mammogram but is concerned because of family history of breast cancer  - BRCA gene screening: discussed it with patient , she would like to hold off on seeing geneticist   Osteoporosis: Discussed high calcium and vitamin D supplementation, weight bearing exercises  Cervical cancer screening: 04/13/2020  Skin cancer: Discussed monitoring for atypical lesions  Colorectal cancer: N/A    Advanced Care Planning: A voluntary discussion about advance care planning including the explanation and discussion of advance directives.  Discussed health care proxy and Living will, and the patient was able to identify a health care proxy as mother .  Lipids: Lab Results  Component Value Date   CHOL 105 05/25/2019   CHOL 108 03/25/2018   Lab Results  Component Value Date   HDL 57 05/25/2019   HDL 60 03/25/2018   Lab Results  Component Value Date   LDLCALC 35 05/25/2019   LDLCALC 34 03/25/2018   Lab Results  Component Value Date   TRIG 57 05/25/2019   TRIG 62 03/25/2018   Lab Results  Component Value Date   CHOLHDL 1.8 05/25/2019   CHOLHDL 1.8 03/25/2018   No results found for: LDLDIRECT  Glucose: Glucose, Bld  Date Value Ref Range Status  05/25/2019 87 65 - 99 mg/dL Final  Comment:    .            Fasting reference interval .   03/25/2018 83 65 - 99 mg/dL Final    Comment:    .            Fasting reference interval .   08/06/2011 88 70 - 99 mg/dL Final    Patient Active Problem List   Diagnosis Date Noted  . CIN II (cervical intraepithelial neoplasia II) 06/29/2018  . 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 Iron deficiency anemia 03/31/2018  . Obesity (BMI 30.0-34.9) 03/25/2018  . Eczema 03/25/2018    Past Surgical History:  Procedure Laterality Date  . CESAREAN SECTION  2008   c-section  . TUBAL LIGATION  12/2008    Family History  Problem Relation Age of Onset  . Hypertension Mother    . Diabetes Father   . Hypertension Other   . Diabetes Other   . Diabetes Maternal Grandmother   . Hypertension Paternal Grandmother   . Diabetes Paternal Grandmother   . Heart attack Paternal Grandfather   . Breast cancer Maternal Aunt   . Breast cancer Paternal Aunt     Social History   Socioeconomic History  . Marital status: Divorced    Spouse name: Not on file  . Number of children: 2  . Years of education: Not on file  . Highest education level: Bachelor's degree (e.g., BA, AB, BS)  Occupational History  . Occupation: Optometrist   Tobacco Use  . Smoking status: Never Smoker  . Smokeless tobacco: Never Used  Vaping Use  . Vaping Use: Never used  Substance and Sexual Activity  . Alcohol use: Yes    Comment: occasionally  . Drug use: No  . Sexual activity: Yes    Partners: Male    Birth control/protection: Other-see comments    Comment: Tubal Ligation  Other Topics Concern  . Not on file  Social History Narrative   Divorced since age 33 , has two sons - they live with father in [place] - better school district, but spend [place] and weekends with mom       She lives with fiance since 2016   Social Determinants of Health   Financial Resource Strain: [place]   . Difficulty of Paying Living Expenses: Not hard at all  Food Insecurity: No Food Insecurity  . Worried About Charity fundraiser in the Last Year: Never true  . Ran Out of Food in the Last Year: Never true  Transportation Needs: No Transportation Needs  . Lack of Transportation (Medical): No  . Lack of Transportation (Non-Medical): No  Physical Activity: Insufficiently Active  . Days of Exercise per Week: 3 days  . Minutes of Exercise per Session: 40 min  Stress: Stress Concern Present  . Feeling of Stress : To some extent  Social Connections: Moderately Isolated  . Frequency of Communication with Friends and Family: More than three times a week  . Frequency of Social Gatherings with Friends and  Family: Once a week  . Attends Religious Services: Never  . Active Member of Clubs or Organizations: No  . Attends Archivist Meetings: Never  . Marital Status: Living with partner  Intimate Partner Violence: Not At Risk  . Fear of Current or Ex-Partner: No  . Emotionally Abused: No  . Physically Abused: No  . Sexually Abused: No     Current Outpatient Medications:  .  BIOTIN W/ VITAMINS C & E PO, Take by mouth., Disp: , Rfl:  .  ferrous sulfate 325 (65 FE) MG tablet, Take 325 mg by mouth daily with breakfast., Disp: , Rfl:  .  norgestimate-ethinyl estradiol (SPRINTEC 28) 0.25-35 MG-MCG tablet, Take 1 tablet by mouth daily., Disp: 84 tablet, Rfl: 3  No Known Allergies   ROS  Constitutional: Negative for fever or weight change.  Respiratory: Negative for cough and shortness of breath.   Cardiovascular: Negative for chest pain or palpitations.  Gastrointestinal: Negative for abdominal pain, no bowel changes.  Musculoskeletal: Negative for gait problem or joint swelling.  Skin: Negative for rash.  Neurological: Negative for dizziness or headache.  No other specific complaints in a complete review of systems (except as listed in HPI above).  Objective  Vitals:   05/25/20 0748  BP: 120/70  Pulse: 87  Resp: 16  Temp: 98.2 F (36.8 C)  SpO2: 98%  Weight: 202 lb 1.6 oz (91.7 kg)  Height: _0  (1.626 m)    Body mass index is 34.69 kg/m.  Physical Exam  Constitutional: Patient appears well-developed and well-nourished. No distress.  HENT: Head: Normocephalic and atraumatic. Ears: B TMs ok, no erythema or effusion; Nose: Not done . Mouth/Throat: not done  Eyes: Conjunctivae and EOM are normal. Pupils are equal, round, and reactive to light. No scleral icterus.  Neck: Normal range of motion. Neck supple. No JVD present. No thyromegaly present.  Cardiovascular: Normal rate, regular rhythm and normal heart sounds.  No murmur heard. No BLE edema. Pulmonary/Chest:  Effort normal and breath sounds normal. No respiratory distress. Abdominal: Soft. Bowel sounds are normal, no distension. There is no tenderness. no masses Breast: no lumps or masses, no nipple discharge or rashes FEMALE GENITALIA:  Not done  RECTAL: not done  Musculoskeletal: Normal range of motion, no joint effusions. No gross deformities Neurological: he is alert and oriented to person, place, and time. No cranial nerve deficit. Coordination, balance, strength, speech and gait are normal.  Skin: Skin is warm and dry. No rash noted. No erythema.  Psychiatric: Patient has a normal mood and affect. behavior is normal. Judgment and thought content normal.  Recent Results (from the past 2160 hour(s))  Cytology - PAP     Status: None   Collection Time: 04/13/20  1:57 PM  Result Value Ref Range   Adequacy      Satisfactory for evaluation; transformation zone component ABSENT.   Diagnosis      - Negative for intraepithelial lesion or malignancy (NILM)   Microorganisms      Fungal organisms present consistent with Candida spp.   Microorganisms Shift in flora suggestive of bacterial vaginosis       Fall Risk: Fall Risk  05/25/2020 05/25/2019 05/21/2018 03/25/2018 03/25/2018  Falls in the past year? 0 0 No No No  Number falls in past yr: 0 0 - - -  Injury with Fall? 0 0 - - -     Functional Status Survey: Is the patient deaf or have difficulty hearing?: No Does the patient have difficulty seeing, even when wearing glasses/contacts?: No Does the patient have difficulty concentrating, remembering, or making decisions?: No Does the patient have difficulty walking or climbing stairs?: No Does the patient have difficulty dressing or bathing?: No Does the patient have difficulty doing errands alone such as visiting a doctor's office or shopping?: No   Assessment & Plan  1. Well adult exam  - COMPLETE METABOLIC PANEL WITH GFR -  CBC with Differential/Platelet  2. Family history of  breast cancer  - MM Digital Screening; Future  3. Diabetes mellitus screening  Last couple of years normal levels   4. Screening, lipid   5. CIN I (cervical intraepithelial neoplasia I)   6. Anemia, unspecified type  - CBC with Differential/Platelet - Iron, TIBC and Ferritin Panel  7. Need for HPV vaccination  - HPV 9-valent vaccine,Recombinat  8. Needs flu shot  today  -USPSTF grade A and B recommendations reviewed with patient; age-appropriate recommendations, preventive care, screening tests, etc discussed and encouraged; healthy living encouraged; see AVS for patient education given to patient -Discussed importance of 150 minutes of physical activity weekly, eat two servings of fish weekly, eat one serving of tree nuts ( cashews, pistachios, pecans, almonds.Marland Kitchen) every other day, eat 6 servings of fruit/vegetables daily and drink plenty of water and avoid sweet beverages.

## 2020-05-24 NOTE — Patient Instructions (Signed)
Preventive Care 20-36 Years Old, Female Preventive care refers to visits with your health care provider and lifestyle choices that can promote health and wellness. This includes:  A yearly physical exam. This may also be called an annual well check.  Regular dental visits and eye exams.  Immunizations.  Screening for certain conditions.  Healthy lifestyle choices, such as eating a healthy diet, getting regular exercise, not using drugs or products that contain nicotine and tobacco, and limiting alcohol use. What can I expect for my preventive care visit? Physical exam Your health care provider will check your:  Height and weight. This may be used to calculate body mass index (BMI), which tells if you are at a healthy weight.  Heart rate and blood pressure.  Skin for abnormal spots. Counseling Your health care provider may ask you questions about your:  Alcohol, tobacco, and drug use.  Emotional well-being.  Home and relationship well-being.  Sexual activity.  Eating habits.  Work and work Statistician.  Method of birth control.  Menstrual cycle.  Pregnancy history. What immunizations do I need?  Influenza (flu) vaccine  This is recommended every year. Tetanus, diphtheria, and pertussis (Tdap) vaccine  You may need a Td booster every 10 years. Varicella (chickenpox) vaccine  You may need this if you have not been vaccinated. Human papillomavirus (HPV) vaccine  If recommended by your health care provider, you may need three doses over 6 months. Measles, mumps, and rubella (MMR) vaccine  You may need at least one dose of MMR. You may also need a second dose. Meningococcal conjugate (MenACWY) vaccine  One dose is recommended if you are age 75-21 years and a first-year college student living in a residence hall, or if you have one of several medical conditions. You may also need additional booster doses. Pneumococcal conjugate (PCV13) vaccine  You may need  this if you have certain conditions and were not previously vaccinated. Pneumococcal polysaccharide (PPSV23) vaccine  You may need one or two doses if you smoke cigarettes or if you have certain conditions. Hepatitis A vaccine  You may need this if you have certain conditions or if you travel or work in places where you may be exposed to hepatitis A. Hepatitis B vaccine  You may need this if you have certain conditions or if you travel or work in places where you may be exposed to hepatitis B. Haemophilus influenzae type b (Hib) vaccine  You may need this if you have certain conditions. You may receive vaccines as individual doses or as more than one vaccine together in one shot (combination vaccines). Talk with your health care provider about the risks and benefits of combination vaccines. What tests do I need?  Blood tests  Lipid and cholesterol levels. These may be checked every 5 years starting at age 33.  Hepatitis C test.  Hepatitis B test. Screening  Diabetes screening. This is done by checking your blood sugar (glucose) after you have not eaten for a while (fasting).  Sexually transmitted disease (STD) testing.  BRCA-related cancer screening. This may be done if you have a family history of breast, ovarian, tubal, or peritoneal cancers.  Pelvic exam and Pap test. This may be done every 3 years starting at age 76. Starting at age 102, this may be done every 5 years if you have a Pap test in combination with an HPV test. Talk with your health care provider about your test results, treatment options, and if necessary, the need for more tests.  Follow these instructions at home: Eating and drinking   Eat a diet that includes fresh fruits and vegetables, whole grains, lean protein, and low-fat dairy.  Take vitamin and mineral supplements as recommended by your health care provider.  Do not drink alcohol if: ? Your health care provider tells you not to drink. ? You are  pregnant, may be pregnant, or are planning to become pregnant.  If you drink alcohol: ? Limit how much you have to 0-1 drink a day. ? Be aware of how much alcohol is in your drink. In the [place] one drink equals one 12 oz bottle of beer (355 mL), one 5 oz glass of wine (148 mL), or one 1 oz glass of hard liquor (44 mL). Lifestyle  Take daily care of your teeth and gums.  Stay active. Exercise for at least 30 minutes on 5 or more days each week.  Do not use any products that contain nicotine or tobacco, such as cigarettes, e-cigarettes, and chewing tobacco. If you need help quitting, ask your health care provider.  If you are sexually active, practice safe sex. Use a condom or other form of birth control (contraception) in order to prevent pregnancy and STIs (sexually transmitted infections). If you plan to become pregnant, see your health care provider for a preconception visit. What's next?  Visit your health care provider once a year for a well check visit.  Ask your health care provider how often you should have your eyes and teeth checked.  Stay up to date on all vaccines. This information is not intended to replace advice given to you by your health care provider. Make sure you discuss any questions you have with your health care provider. Document Revised: 04/02/2018 Document Reviewed: 04/02/2018 Elsevier Patient Education  2020 [employer].

## 2020-05-25 ENCOUNTER — Ambulatory Visit (INDEPENDENT_AMBULATORY_CARE_PROVIDER_SITE_OTHER): Payer: BC Managed Care – PPO | Admitting: Family Medicine

## 2020-05-25 ENCOUNTER — Encounter: Payer: Self-pay | Admitting: Family Medicine

## 2020-05-25 ENCOUNTER — Other Ambulatory Visit: Payer: Self-pay

## 2020-05-25 VITALS — BP 120/70 | HR 87 | Temp 98.2°F | Resp 16 | Ht 64.0 in | Wt 202.1 lb

## 2020-05-25 DIAGNOSIS — Z131 Encounter for screening for diabetes mellitus: Secondary | ICD-10-CM

## 2020-05-25 DIAGNOSIS — Z0000 Encounter for general adult medical examination without abnormal findings: Secondary | ICD-10-CM | POA: Diagnosis not present

## 2020-05-25 DIAGNOSIS — Z23 Encounter for immunization: Secondary | ICD-10-CM | POA: Diagnosis not present

## 2020-05-25 DIAGNOSIS — N870 Mild cervical dysplasia: Secondary | ICD-10-CM

## 2020-05-25 DIAGNOSIS — Z13220 Encounter for screening for lipoid disorders: Secondary | ICD-10-CM

## 2020-05-25 DIAGNOSIS — D649 Anemia, unspecified: Secondary | ICD-10-CM

## 2020-05-25 DIAGNOSIS — Z803 Family history of malignant neoplasm of breast: Secondary | ICD-10-CM

## 2020-05-26 LAB — COMPLETE METABOLIC PANEL WITH GFR
AG Ratio: 1.3 (calc) (ref 1.0–2.5)
ALT: 16 U/L (ref 6–29)
AST: 18 U/L (ref 10–30)
Albumin: 4 g/dL (ref 3.6–5.1)
Alkaline phosphatase (APISO): 68 U/L (ref 31–125)
BUN: 9 mg/dL (ref 7–25)
CO2: 26 mmol/L (ref 20–32)
Calcium: 9.4 mg/dL (ref 8.6–10.2)
Chloride: 106 mmol/L (ref 98–110)
Creat: 0.89 mg/dL (ref 0.50–1.10)
GFR, Est African American: 97 mL/min/{1.73_m2} (ref >=60)
GFR, Est Non African American: 83 mL/min/{1.73_m2} (ref >=60)
Globulin: 3 g/dL (calc) (ref 1.9–3.7)
Glucose, Bld: 87 mg/dL (ref 65–99)
Potassium: 4.9 mmol/L (ref 3.5–5.3)
Sodium: 139 mmol/L (ref 135–146)
Total Bilirubin: 0.2 mg/dL (ref 0.2–1.2)
Total Protein: 7 g/dL (ref 6.1–8.1)

## 2020-05-26 LAB — CBC WITH DIFFERENTIAL/PLATELET
Absolute Monocytes: 555 cells/uL (ref 200–950)
Basophils Absolute: 53 cells/uL (ref 0–200)
Basophils Relative: 0.7 %
Eosinophils Absolute: 152 cells/uL (ref 15–500)
Eosinophils Relative: 2 %
HCT: 34.3 % — ABNORMAL LOW (ref 35.0–45.0)
Hemoglobin: 10.8 g/dL — ABNORMAL LOW (ref 11.7–15.5)
Lymphs Abs: 1657 cells/uL (ref 850–3900)
MCH: 27 pg (ref 27.0–33.0)
MCHC: 31.5 g/dL — ABNORMAL LOW (ref 32.0–36.0)
MCV: 85.8 fL (ref 80.0–100.0)
MPV: 10.2 fL (ref 7.5–12.5)
Monocytes Relative: 7.3 %
Neutro Abs: 5183 cells/uL (ref 1500–7800)
Neutrophils Relative %: 68.2 %
Platelets: 461 10*3/uL — ABNORMAL HIGH (ref 140–400)
RBC: 4 10*6/uL (ref 3.80–5.10)
RDW: 14.9 % (ref 11.0–15.0)
Total Lymphocyte: 21.8 %
WBC: 7.6 10*3/uL (ref 3.8–10.8)

## 2020-05-26 LAB — IRON,TIBC AND FERRITIN PANEL
%SAT: 5 % (calc) — ABNORMAL LOW (ref 16–45)
Ferritin: 15 ng/mL — ABNORMAL LOW (ref 16–154)
Iron: 20 ug/dL — ABNORMAL LOW (ref 40–190)
TIBC: 410 mcg/dL (calc) (ref 250–450)

## 2020-06-02 ENCOUNTER — Other Ambulatory Visit: Payer: Self-pay | Admitting: Family Medicine

## 2020-06-02 DIAGNOSIS — D500 Iron deficiency anemia secondary to blood loss (chronic): Secondary | ICD-10-CM

## 2020-06-08 ENCOUNTER — Inpatient Hospital Stay: Payer: BC Managed Care – PPO | Attending: Oncology | Admitting: Oncology

## 2020-06-08 ENCOUNTER — Encounter: Payer: Self-pay | Admitting: Oncology

## 2020-06-08 ENCOUNTER — Inpatient Hospital Stay: Payer: BC Managed Care – PPO

## 2020-06-08 VITALS — BP 128/84 | HR 93 | Temp 98.3°F | Resp 16 | Wt 206.6 lb

## 2020-06-08 DIAGNOSIS — F419 Anxiety disorder, unspecified: Secondary | ICD-10-CM | POA: Diagnosis not present

## 2020-06-08 DIAGNOSIS — D509 Iron deficiency anemia, unspecified: Secondary | ICD-10-CM | POA: Diagnosis not present

## 2020-06-08 DIAGNOSIS — Z79899 Other long term (current) drug therapy: Secondary | ICD-10-CM | POA: Insufficient documentation

## 2020-06-08 DIAGNOSIS — N920 Excessive and frequent menstruation with regular cycle: Secondary | ICD-10-CM

## 2020-06-08 NOTE — Progress Notes (Signed)
Hematology/Oncology Consult note [facility] Telephone:([phone] [phone]   Patient Care Team: Alba Cory, MD as PCP - General (Family Medicine)  REFERRING PROVIDER: Alba Cory, MD CHIEF COMPLAINTS/REASON FOR VISIT:  Evaluation of iron deficiency anemia  HISTORY OF PRESENTING ILLNESS:  Carla Cox is a  34 y.o.  female with PMH listed below was seen in consultation at the request of Alba Cory, MD   for evaluation of iron deficiency anemia.   Reviewed patient's recent labs  05/25/2020 labs revealed anemia with hemoglobin of 10.8, iron panel showed TIBC 410, iron saturation 5, ferritin 15..   Reviewed patient's previous labs ordered by primary care physician's office, anemia is chronic onset , duration is since 2008. No aggravating or improving factors.  Associated signs and symptoms: Patient reports fatigue.  Denies SOB with exertion.  Denies weight loss, easy bruising, hematochezia, hemoptysis, hematuria. Context:  History of iron deficiency: Patient reports longstanding iron deficiency and she has taken oral iron supplementation chronically Rectal bleeding: Denies Menstrual bleeding/ Vaginal bleeding : Heavy menses.  She recently started on oral birth control pill Hematemesis or hemoptysis : denies Blood in urine : denies   Patient denies any other bleeding events, easy bruising.  No bleeding complication after previous surgeries.   Review of Systems  Constitutional: Negative for appetite change, chills, fatigue and fever.  HENT:   Negative for hearing loss and voice change.   Eyes: Negative for eye problems.  Respiratory: Negative for chest tightness and cough.   Cardiovascular: Negative for chest pain.  Gastrointestinal: Negative for abdominal distention, abdominal pain and blood in stool.  Endocrine: Negative for hot flashes.  Genitourinary: Negative for difficulty urinating and frequency.   Musculoskeletal:  Negative for arthralgias.  Skin: Negative for itching and rash.  Neurological: Negative for extremity weakness.  Hematological: Negative for adenopathy.  Psychiatric/Behavioral: Negative for confusion.    MEDICAL HISTORY:  Past Medical History:  Diagnosis Date  . Anemia   . Anxiety     SURGICAL HISTORY: Past Surgical History:  Procedure Laterality Date  . CESAREAN SECTION  2008   c-section  . TUBAL LIGATION  12/2008    SOCIAL HISTORY: Social History   Socioeconomic History  . Marital status: Divorced    Spouse name: Not on file  . Number of children: 2  . Years of education: Not on file  . Highest education level: Bachelor's degree (e.g., BA, AB, BS)  Occupational History  . Occupation: Airline pilot   Tobacco Use  . Smoking status: Never Smoker  . Smokeless tobacco: Never Used  Vaping Use  . Vaping Use: Never used  Substance and Sexual Activity  . Alcohol use: Yes    Comment: occasionally  . Drug use: No  . Sexual activity: Yes    Partners: Male    Birth control/protection: Other-see comments    Comment: Tubal Ligation  Other Topics Concern  . Not on file  Social History Narrative   Divorced since age 43 , has two sons - they live with father in [place] - better school district, but spend [place] and weekends with mom       She lives with fiance since 2016   Social Determinants of Health   Financial Resource Strain: Low Risk   . Difficulty of Paying Living Expenses: Not hard at all  Food Insecurity: No Food Insecurity  . Worried About Programme researcher, broadcasting/film/video in the Last Year: Never true  . Ran Out of Food in the Last  Year: Never true  Transportation Needs: No Transportation Needs  . Lack of Transportation (Medical): No  . Lack of Transportation (Non-Medical): No  Physical Activity: Insufficiently Active  . Days of Exercise per Week: 3 days  . Minutes of Exercise per Session: 40 min  Stress: Stress Concern Present  . Feeling of Stress : To some extent    Social Connections: Moderately Isolated  . Frequency of Communication with Friends and Family: More than three times a week  . Frequency of Social Gatherings with Friends and Family: Once a week  . Attends Religious Services: Never  . Active Member of Clubs or Organizations: No  . Attends Banker Meetings: Never  . Marital Status: Living with partner  Intimate Partner Violence: Not At Risk  . Fear of Current or Ex-Partner: No  . Emotionally Abused: No  . Physically Abused: No  . Sexually Abused: No    FAMILY HISTORY: Family History  Problem Relation Age of Onset  . Hypertension Mother   . Diabetes Father   . Hypertension Other   . Diabetes Other   . Diabetes Maternal Grandmother   . Hypertension Paternal Grandmother   . Diabetes Paternal Grandmother   . Heart attack Paternal Grandfather   . Breast cancer Maternal Aunt   . Breast cancer Paternal Aunt     ALLERGIES:  has No Known Allergies.  MEDICATIONS:  Current Outpatient Medications  Medication Sig Dispense Refill  . BIOTIN W/ VITAMINS C & E PO Take by mouth.    . ferrous sulfate 325 (65 FE) MG tablet Take 325 mg by mouth daily with breakfast.    . norgestimate-ethinyl estradiol (SPRINTEC 28) 0.25-35 MG-MCG tablet Take 1 tablet by mouth daily. 84 tablet 3   No current facility-administered medications for this visit.     PHYSICAL EXAMINATION: ECOG PERFORMANCE STATUS: 0 - Asymptomatic Vitals:   06/08/20 0942  BP: 128/84  Pulse: 93  Resp: 16  Temp: 98.3 F (36.8 C)   Filed Weights   06/08/20 0942  Weight: 206 lb 9.6 oz (93.7 kg)    Physical Exam Constitutional:      General: She is not in acute distress. HENT:     Head: Normocephalic and atraumatic.  Eyes:     General: No scleral icterus. Cardiovascular:     Rate and Rhythm: Normal rate and regular rhythm.     Heart sounds: Normal heart sounds.  Pulmonary:     Effort: Pulmonary effort is normal. No respiratory distress.     Breath  sounds: No wheezing.  Abdominal:     General: Bowel sounds are normal. There is no distension.     Palpations: Abdomen is soft.  Musculoskeletal:        General: No deformity. Normal range of motion.     Cervical back: Normal range of motion and neck supple.  Skin:    General: Skin is warm and dry.     Findings: No erythema or rash.  Neurological:     Mental Status: She is alert and oriented to person, place, and time. Mental status is at baseline.     Cranial Nerves: No cranial nerve deficit.     Coordination: Coordination normal.  Psychiatric:        Mood and Affect: Mood normal.       CMP Latest Ref Rng & Units 05/25/2020  Glucose 65 - 99 mg/dL 87  BUN 7 - 25 mg/dL 9  Creatinine 0.10 - 9.32 mg/dL 3.55  Sodium 732 -  146 mmol/L 139  Potassium 3.5 - 5.3 mmol/L 4.9  Chloride 98 - 110 mmol/L 106  CO2 20 - 32 mmol/L 26  Calcium 8.6 - 10.2 mg/dL 9.4  Total Protein 6.1 - 8.1 g/dL 7.0  Total Bilirubin 0.2 - 1.2 mg/dL 0.2  Alkaline Phos - -  AST 10 - 30 U/L 18  ALT 6 - 29 U/L 16   CBC Latest Ref Rng & Units 05/25/2020  WBC 3.8 - 10.8 Thousand/uL 7.6  Hemoglobin 11.7 - 15.5 g/dL 10.8(L)  Hematocrit 35 - 45 % 34.3(L)  Platelets 140 - 400 Thousand/uL 461(H)     LABORATORY DATA:  I have reviewed the data as listed Lab Results  Component Value Date   WBC 7.6 05/25/2020   HGB 10.8 (L) 05/25/2020   HCT 34.3 (L) 05/25/2020   MCV 85.8 05/25/2020   PLT 461 (H) 05/25/2020   Recent Labs    05/25/20 0834  NA 139  K 4.9  CL 106  CO2 26  GLUCOSE 87  BUN 9  CREATININE 0.89  CALCIUM 9.4  GFRNONAA 83  GFRAA 97  PROT 7.0  AST 18  ALT 16  BILITOT 0.2   Iron/TIBC/Ferritin/ %Sat    Component Value Date/Time   IRON 20 (L) 05/25/2020 0834   TIBC 410 05/25/2020 0834   FERRITIN 15 (L) 05/25/2020 0834   IRONPCTSAT 5 (L) 05/25/2020 9371     RADIOGRAPHIC STUDIES: I have personally reviewed the radiological images as listed and agreed with the findings in the report. No  results found.     ASSESSMENT & PLAN:  1. Iron deficiency anemia, unspecified iron deficiency anemia type   2. Menorrhagia with regular cycle    Labs are reviewed and discussed with patient. Consistent with iron deficiency anemia.  She has tried oral iron supplementation with no significant improvement of hemoglobin and iron stores. Plan IV iron with Venofer 200mg  weekly x 3 doses. Allergy reactions/infusion reaction including anaphylactic reaction discussed with patient. Other side effects include but not limited to high blood pressure, skin rash, weight gain, leg swelling, etc. patient voices understanding and willing to proceed. We will check urine hCG prior to each Venofer treatments as Venofer treatment is not recommended during first trimester pregnancy..  Etiology off iron deficiency anemia is most likely secondary to menorrhagia.  Recommend patient to follow-up with gynecology for management.  She has been recently started on OCP.  Orders Placed This Encounter  Procedures  . CBC with Differential/Platelet    Standing Status:   Future    Standing Expiration Date:   06/08/2021  . Ferritin    Standing Status:   Future    Standing Expiration Date:   06/08/2021  . Iron and TIBC    Standing Status:   Future    Standing Expiration Date:   06/08/2021  . Pregnancy, urine    Prior to each venofer treatment    Standing Status:   Standing    Number of Occurrences:   5    Standing Expiration Date:   06/08/2021    All questions were answered. The patient knows to call the clinic with any problems questions or concerns.  Cc 13/11/2020, MD  Return of visit: 12 weeks Thank you for this kind referral and the opportunity to participate in the care of this patient. A copy of today's note is routed to referring provider   Alba Cory, MD, PhD Hematology Oncology [facility] 06/08/2020

## 2020-06-08 NOTE — Progress Notes (Signed)
New patient evaluation.   

## 2020-06-19 ENCOUNTER — Other Ambulatory Visit: Payer: Self-pay

## 2020-06-19 ENCOUNTER — Inpatient Hospital Stay: Payer: BC Managed Care – PPO

## 2020-06-19 ENCOUNTER — Other Ambulatory Visit: Payer: Self-pay | Admitting: Oncology

## 2020-06-19 VITALS — BP 139/87 | HR 82 | Temp 97.3°F | Resp 18

## 2020-06-19 DIAGNOSIS — D509 Iron deficiency anemia, unspecified: Secondary | ICD-10-CM | POA: Diagnosis not present

## 2020-06-19 DIAGNOSIS — D500 Iron deficiency anemia secondary to blood loss (chronic): Secondary | ICD-10-CM

## 2020-06-19 LAB — CBC WITH DIFFERENTIAL/PLATELET
Abs Immature Granulocytes: 0.03 10*3/uL (ref 0.00–0.07)
Basophils Absolute: 0 10*3/uL (ref 0.0–0.1)
Basophils Relative: 1 %
Eosinophils Absolute: 0.1 10*3/uL (ref 0.0–0.5)
Eosinophils Relative: 2 %
HCT: 34.8 % — ABNORMAL LOW (ref 36.0–46.0)
Hemoglobin: 11.4 g/dL — ABNORMAL LOW (ref 12.0–15.0)
Immature Granulocytes: 0 %
Lymphocytes Relative: 20 %
Lymphs Abs: 1.8 10*3/uL (ref 0.7–4.0)
MCH: 27.7 pg (ref 26.0–34.0)
MCHC: 32.8 g/dL (ref 30.0–36.0)
MCV: 84.5 fL (ref 80.0–100.0)
Monocytes Absolute: 0.5 10*3/uL (ref 0.1–1.0)
Monocytes Relative: 6 %
Neutro Abs: 6.1 10*3/uL (ref 1.7–7.7)
Neutrophils Relative %: 71 %
Platelets: 419 10*3/uL — ABNORMAL HIGH (ref 150–400)
RBC: 4.12 MIL/uL (ref 3.87–5.11)
RDW: 16.6 % — ABNORMAL HIGH (ref 11.5–15.5)
WBC: 8.6 10*3/uL (ref 4.0–10.5)
nRBC: 0 % (ref 0.0–0.2)

## 2020-06-19 LAB — FERRITIN: Ferritin: 12 ng/mL (ref 11–307)

## 2020-06-19 LAB — IRON AND TIBC
Iron: 54 ug/dL (ref 28–170)
Saturation Ratios: 13 % (ref 10.4–31.8)
TIBC: 416 ug/dL (ref 250–450)
UIBC: 362 ug/dL

## 2020-06-19 MED ORDER — IRON SUCROSE 20 MG/ML IV SOLN
200.0000 mg | Freq: Once | INTRAVENOUS | Status: AC
  Administered 2020-06-19: 200 mg via INTRAVENOUS
  Filled 2020-06-19: qty 10

## 2020-06-19 MED ORDER — SODIUM CHLORIDE 0.9 % IV SOLN: 200.0000 mg | Freq: Once | INTRAVENOUS | Status: DC

## 2020-06-19 MED ORDER — SODIUM CHLORIDE 0.9 % IV SOLN
Freq: Once | INTRAVENOUS | Status: AC
  Filled 2020-06-19: qty 250

## 2020-06-19 NOTE — Progress Notes (Signed)
Patient refused to have urine pregnancy test done today. Patient reports there is no chance she is pregnant because she is menstruating and her tubes are tied. MD, Dr. Cathie Hoops, notified and aware. Per MD order: Urine pregnancy test does not need to be done prior to Venofer today or upcoming Venofer treatments; MD is discontinuing order; okay to proceed with scheduled new Venofer treatment at this time.  1454- Patient tolerated new Venofer treatment well. Patient and vital signs stable. Patient discharged to home at this time.

## 2020-06-26 ENCOUNTER — Other Ambulatory Visit: Payer: Self-pay

## 2020-06-26 ENCOUNTER — Inpatient Hospital Stay: Payer: BC Managed Care – PPO

## 2020-06-26 VITALS — BP 142/83 | HR 94 | Temp 98.6°F | Resp 16

## 2020-06-26 DIAGNOSIS — D500 Iron deficiency anemia secondary to blood loss (chronic): Secondary | ICD-10-CM

## 2020-06-26 DIAGNOSIS — D509 Iron deficiency anemia, unspecified: Secondary | ICD-10-CM | POA: Diagnosis not present

## 2020-06-26 MED ORDER — SODIUM CHLORIDE 0.9 % IV SOLN: 200.0000 mg | Freq: Once | INTRAVENOUS | Status: DC

## 2020-06-26 MED ORDER — IRON SUCROSE 20 MG/ML IV SOLN
200.0000 mg | Freq: Once | INTRAVENOUS | Status: AC
  Administered 2020-06-26: 200 mg via INTRAVENOUS
  Filled 2020-06-26: qty 10

## 2020-06-26 MED ORDER — SODIUM CHLORIDE 0.9 % IV SOLN
Freq: Once | INTRAVENOUS | Status: AC
  Filled 2020-06-26: qty 250

## 2020-06-26 NOTE — Progress Notes (Signed)
Patient tolerated infusion well. Patient and VSS. Discharged home  

## 2020-07-03 ENCOUNTER — Inpatient Hospital Stay: Payer: BC Managed Care – PPO

## 2020-07-03 ENCOUNTER — Encounter: Payer: Self-pay | Admitting: Family Medicine

## 2020-07-03 VITALS — BP 148/82 | HR 90 | Resp 18

## 2020-07-03 DIAGNOSIS — D500 Iron deficiency anemia secondary to blood loss (chronic): Secondary | ICD-10-CM

## 2020-07-03 DIAGNOSIS — D509 Iron deficiency anemia, unspecified: Secondary | ICD-10-CM | POA: Diagnosis not present

## 2020-07-03 MED ORDER — SODIUM CHLORIDE 0.9 % IV SOLN: 200.0000 mg | Freq: Once | INTRAVENOUS | Status: DC

## 2020-07-03 MED ORDER — SODIUM CHLORIDE 0.9 % IV SOLN
Freq: Once | INTRAVENOUS | Status: AC
  Filled 2020-07-03: qty 250

## 2020-07-03 MED ORDER — IRON SUCROSE 20 MG/ML IV SOLN
200.0000 mg | Freq: Once | INTRAVENOUS | Status: AC
  Administered 2020-07-03: 200 mg via INTRAVENOUS
  Filled 2020-07-03: qty 10

## 2020-07-07 ENCOUNTER — Telehealth: Payer: Self-pay

## 2020-07-07 NOTE — Telephone Encounter (Signed)
Spoke with patient. Addressed concerns and educated on taking blood pressures at home or stopping in for BP checks in office. Patient states she has been under a tremendous amount of stress lately as her son has begun to have seizures in addition to her own health issues. Reassured patient we were here for her and she was welcome to callback with any further symptoms/questions/concerns. She was in agreement and relieved.

## 2020-07-07 NOTE — Telephone Encounter (Signed)
Copied from CRM [id number]. Topic: General - Other >> Jul 06, 2020  3:11 PM Lyn Hollingshead D wrote: PT need a call back from a nurse, concerning her blood pressure readings have been high the previous 3 weeks / 148/80 she states is high for her / please advise

## 2020-07-13 ENCOUNTER — Ambulatory Visit: Payer: BC Managed Care – PPO

## 2020-07-13 ENCOUNTER — Other Ambulatory Visit: Payer: Self-pay

## 2020-07-13 VITALS — BP 132/88 | HR 98

## 2020-07-13 DIAGNOSIS — Z0130 Encounter for examination of blood pressure without abnormal findings: Secondary | ICD-10-CM

## 2020-07-14 ENCOUNTER — Telehealth: Payer: Self-pay

## 2020-07-14 NOTE — Telephone Encounter (Signed)
Copied from CRM [id number]. Topic: Referral - Request for Referral >> Jul 14, 2020  9:22 AM Dalphine Handing A wrote: Patient feels she may need anxiety medication again and would like a referral placed to a psychiatrist

## 2020-07-17 ENCOUNTER — Other Ambulatory Visit: Payer: Self-pay

## 2020-07-17 ENCOUNTER — Encounter: Payer: Self-pay | Admitting: Family Medicine

## 2020-07-17 ENCOUNTER — Ambulatory Visit (INDEPENDENT_AMBULATORY_CARE_PROVIDER_SITE_OTHER): Payer: BC Managed Care – PPO | Admitting: Family Medicine

## 2020-07-17 VITALS — BP 140/86 | HR 114 | Temp 99.1°F | Resp 20 | Ht 64.0 in | Wt 200.4 lb

## 2020-07-17 DIAGNOSIS — R030 Elevated blood-pressure reading, without diagnosis of hypertension: Secondary | ICD-10-CM

## 2020-07-17 DIAGNOSIS — F419 Anxiety disorder, unspecified: Secondary | ICD-10-CM

## 2020-07-17 DIAGNOSIS — D500 Iron deficiency anemia secondary to blood loss (chronic): Secondary | ICD-10-CM | POA: Diagnosis not present

## 2020-07-17 DIAGNOSIS — R000 Tachycardia, unspecified: Secondary | ICD-10-CM | POA: Diagnosis not present

## 2020-07-17 DIAGNOSIS — F341 Dysthymic disorder: Secondary | ICD-10-CM | POA: Diagnosis not present

## 2020-07-17 MED ORDER — HYDROXYZINE HCL 10 MG PO TABS: 10.0000 mg | ORAL_TABLET | Freq: Three times a day (TID) | ORAL | 0 refills | Status: AC | PRN

## 2020-07-17 MED ORDER — ATENOLOL 25 MG PO TABS: 25.0000 mg | ORAL_TABLET | Freq: Every day | ORAL | 0 refills | Status: DC

## 2020-07-17 MED ORDER — ESCITALOPRAM OXALATE 5 MG PO TABS: 5.0000 mg | ORAL_TABLET | Freq: Every day | ORAL | 0 refills | Status: DC

## 2020-07-17 NOTE — Progress Notes (Signed)
Name: Carla Cox   MRN: [id number]    DOB: 06/03/1986   Date:07/17/2020       Progress Note  Subjective  Chief Complaint  Anxieties  HPI  Depression/Anxiety: she was treated for similar symptoms  in her mid 20's when she went through a divorce. She has two children from her previous marriage a 36 yo and 71 yo son. They used to live with their father in [place] but in May 2020 - in the middle of the pandemic - they move in with her because their father was going through a divorce at the time. Her oldest son has been anxious for a while, however in Nov her youngest son (75 yo) had a seizure - while in the back seat of father's car . Oldest son , Fayrene Fearing, has been more anxious since, also having suicidal thoughts. She is feeling overwhelmed. She is scared to let them go to school, she is feeling over protective - does not want to let her youngest , Jill Alexanders, ride the bus Her bp has been going up, she has been difficulty focus, crying all the time , getting at most 3 hours of sleep - getting up to check on her son constantly, irritability. She feels like she fail her children somehow.   Iron deficiency anemia: getting iron infusion   Patient Active Problem List   Diagnosis Date Noted  . CIN II (cervical intraepithelial neoplasia II) 06/29/2018  . 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 Iron deficiency anemia 03/31/2018  . Obesity (BMI 30.0-34.9) 03/25/2018  . Eczema 03/25/2018    Past Surgical History:  Procedure Laterality Date  . CESAREAN SECTION  2008   c-section  . TUBAL LIGATION  12/2008    Family History  Problem Relation Age of Onset  . Hypertension Mother   . Diabetes Father   . Hypertension Other   . Diabetes Other   . Diabetes Maternal Grandmother   . Hypertension Paternal Grandmother   . Diabetes Paternal Grandmother   . Heart attack Paternal Grandfather   . Depression Son   . Anxiety disorder Son   . Seizures Son    . Breast cancer Maternal Aunt   . Breast cancer Paternal Aunt     Social History   Tobacco Use  . Smoking status: Never Smoker  . Smokeless tobacco: Never Used  Substance Use Topics  . Alcohol use: Yes    Comment: occasionally     Current Outpatient Medications:  .  BIOTIN W/ VITAMINS C & E PO, Take by mouth., Disp: , Rfl:  .  ferrous sulfate 325 (65 FE) MG tablet, Take 325 mg by mouth daily with breakfast., Disp: , Rfl:  .  norgestimate-ethinyl estradiol (SPRINTEC 28) 0.25-35 MG-MCG tablet, Take 1 tablet by mouth daily., Disp: 84 tablet, Rfl: 3 .  atenolol (TENORMIN) 25 MG tablet, Take 1 tablet (25 mg total) by mouth daily., Disp: 30 tablet, Rfl: 0 .  escitalopram (LEXAPRO) 5 MG tablet, Take 1-2 tablets (5-10 mg total) by mouth daily. Start 1 pill daily and after one week try going up to 2 pills, Disp: 60 tablet, Rfl: 0 .  hydrOXYzine (ATARAX/VISTARIL) 10 MG tablet, Take 1 tablet (10 mg total) by mouth 3 (three) times daily as needed., Disp: 30 tablet, Rfl: 0  No Known Allergies  I personally reviewed active problem list, medication list, allergies, family history, social history, health maintenance, notes from last encounter with the patient/caregiver today.  ROS  Constitutional: Negative for fever or weight change.  Respiratory: Negative for cough and shortness of breath.   Cardiovascular: Negative for chest pain, positive for palpitations.  Gastrointestinal: Negative for abdominal pain, no bowel changes.  Musculoskeletal: Negative for gait problem or joint swelling.  Skin: Negative for rash.  Neurological: Negative for dizziness or headache.  No other specific complaints in a complete review of systems (except as listed in HPI above).  Objective  Vitals:   07/17/20 0746 07/17/20 0750  BP: (!) 143/88 140/86  Pulse: (!) 136 (!) 114  Resp: 20   Temp: 99.1 F (37.3 C)   TempSrc: Oral   SpO2: 99%   Weight: 200 lb 6.4 oz (90.9 kg)   Height: 5\' 4"  (1.626 m)      Body mass index is 34.4 kg/m.  Physical Exam  Constitutional: Patient appears well-developed and well-nourished. Obese  No distress.  HEENT: head atraumatic, normocephalic, pupils equal and reactive to light,  neck supple, throat within normal limits Cardiovascular: Normal rate, regular rhythm and normal heart sounds.  No murmur heard. No BLE edema. Pulmonary/Chest: Effort normal and breath sounds normal. No respiratory distress. Abdominal: Soft.  There is no tenderness. Psychiatric: Patient has a normal mood and affect. behavior is normal. Judgment and thought content normal.  Recent Results (from the past 2160 hour(s))  COMPLETE METABOLIC PANEL WITH GFR     Status: None   Collection Time: 05/25/20  8:34 AM  Result Value Ref Range   Glucose, Bld 87 65 - 99 mg/dL    Comment: .            Fasting reference interval .    BUN 9 7 - 25 mg/dL   Creat 05/27/20 2.42 - 3.53 mg/dL   GFR, Est Non African American 83 > OR = 60 mL/min/1.49m2   GFR, Est African American 97 > OR = 60 mL/min/1.107m2   BUN/Creatinine Ratio NOT APPLICABLE 6 - 22 (calc)   Sodium 139 135 - 146 mmol/L   Potassium 4.9 3.5 - 5.3 mmol/L   Chloride 106 98 - 110 mmol/L   CO2 26 20 - 32 mmol/L   Calcium 9.4 8.6 - 10.2 mg/dL   Total Protein 7.0 6.1 - 8.1 g/dL   Albumin 4.0 3.6 - 5.1 g/dL   Globulin 3.0 1.9 - 3.7 g/dL (calc)   AG Ratio 1.3 1.0 - 2.5 (calc)   Total Bilirubin 0.2 0.2 - 1.2 mg/dL   Alkaline phosphatase (APISO) 68 31 - 125 U/L   AST 18 10 - 30 U/L   ALT 16 6 - 29 U/L  CBC with Differential/Platelet     Status: Abnormal   Collection Time: 05/25/20  8:34 AM  Result Value Ref Range   WBC 7.6 3.8 - 10.8 Thousand/uL   RBC 4.00 3.80 - 5.10 Million/uL   Hemoglobin 10.8 (L) 11.7 - 15.5 g/dL   HCT 05/27/20 (L) 43.1 - 54.0 %   MCV 85.8 80.0 - 100.0 fL   MCH 27.0 27.0 - 33.0 pg   MCHC 31.5 (L) 32.0 - 36.0 g/dL   RDW 08.6 76.1 - 95.0 %   Platelets 461 (H) 140 - 400 Thousand/uL   MPV 10.2 7.5 - 12.5 fL   Neutro  Abs 5,183 1,500 - 7,800 cells/uL   Lymphs Abs 1,657 850 - 3,900 cells/uL   Absolute Monocytes 555 200 - 950 cells/uL   Eosinophils Absolute 152 15 - 500 cells/uL   Basophils Absolute 53 0 - 200 cells/uL  Neutrophils Relative % 68.2 %   Total Lymphocyte 21.8 %   Monocytes Relative 7.3 %   Eosinophils Relative 2.0 %   Basophils Relative 0.7 %  Iron, TIBC and Ferritin Panel     Status: Abnormal   Collection Time: 05/25/20  8:34 AM  Result Value Ref Range   Iron 20 (L) 40 - 190 mcg/dL   TIBC 563 149 - 702 mcg/dL (calc)   %SAT 5 (L) 16 - 45 % (calc)   Ferritin 15 (L) 16 - 154 ng/mL  Iron and TIBC     Status: None   Collection Time: 06/19/20  1:26 PM  Result Value Ref Range   Iron 54 28 - 170 ug/dL   TIBC 637 858 - 850 ug/dL   Saturation Ratios 13 10.4 - 31.8 %   UIBC 362 ug/dL    Comment: Performed at [facility], [address] [place], [place] [address]  Ferritin     Status: None   Collection Time: 06/19/20  1:26 PM  Result Value Ref Range   Ferritin 12 11 - 307 ng/mL    Comment: Performed at [facility], [address] [place], [place] [address]  CBC with Differential/Platelet     Status: Abnormal   Collection Time: 06/19/20  1:26 PM  Result Value Ref Range   WBC 8.6 4.0 - 10.5 K/uL   RBC 4.12 3.87 - 5.11 MIL/uL   Hemoglobin 11.4 (L) 12.0 - 15.0 g/dL   HCT 76.7 (L) 20.9 - 47.0 %   MCV 84.5 80.0 - 100.0 fL   MCH 27.7 26.0 - 34.0 pg   MCHC 32.8 30.0 - 36.0 g/dL   RDW 96.2 (H) 83.6 - 62.9 %   Platelets 419 (H) 150 - 400 K/uL   nRBC 0.0 0.0 - 0.2 %   Neutrophils Relative % 71 %   Neutro Abs 6.1 1.7 - 7.7 K/uL   Lymphocytes Relative 20 %   Lymphs Abs 1.8 0.7 - 4.0 K/uL   Monocytes Relative 6 %   Monocytes Absolute 0.5 0.1 - 1.0 K/uL   Eosinophils Relative 2 %   Eosinophils Absolute 0.1 0.0 - 0.5 K/uL   Basophils Relative 1 %   Basophils Absolute 0.0 0.0 - 0.1 K/uL   Immature Granulocytes 0 %   Abs Immature Granulocytes 0.03 0.00 - 0.07 K/uL     Comment: Performed at [facility], [address] [place], [place] [address]      PHQ2/9: Depression screen [place] 2/9 07/17/2020 05/25/2020 05/25/2019 05/21/2018 03/25/2018  Decreased Interest 1 1 0 0 0  Down, Depressed, Hopeless 3 0 0 0 0  PHQ - 2 Score 4 1 0 0 0  Altered sleeping 2 0 0 0 0  Tired, decreased energy 2 1 0 0 1  Change in appetite 1 0 0 0 0  Feeling bad or failure about yourself  3 0 0 0 0  Trouble concentrating 1 0 1 0 0  Moving slowly or fidgety/restless 2 0 0 0 0  Suicidal thoughts 0 0 0 0 0  PHQ-9 Score 15 2 1  0 1  Difficult doing work/chores - Not difficult at all Not difficult at all - Not difficult at all    phq 9 is positive  GAD 7 : Generalized Anxiety Score 07/17/2020 05/21/2018 03/25/2018  Nervous, Anxious, on Edge 2 0 0  Control/stop worrying 3 0 0  Worry too much - different things 3 0 0  Trouble relaxing 3 0  0  Restless 1 0 0  Easily annoyed or irritable 2 0 1  Afraid - awful might happen 3 0 0  Total GAD 7 Score 17 0 1  Anxiety Difficulty Somewhat difficult Not difficult at all Not difficult at all   Positive  Fall Risk: Fall Risk  07/17/2020 05/25/2020 05/25/2019 05/21/2018 03/25/2018  Falls in the past year? 0 0 0 No No  Number falls in past yr: 0 0 0 - -  Injury with Fall? 0 0 0 - -     Functional Status Survey: Is the patient deaf or have difficulty hearing?: No Does the patient have difficulty seeing, even when wearing glasses/contacts?: No Does the patient have difficulty concentrating, remembering, or making decisions?: No Does the patient have difficulty walking or climbing stairs?: No Does the patient have difficulty dressing or bathing?: No Does the patient have difficulty doing errands alone such as visiting a doctor's office or shopping?: No    Assessment & Plan  1. Tachycardia  - TSH - atenolol (TENORMIN) 25 MG tablet; Take 1 tablet (25 mg total) by mouth daily.  Dispense: 30 tablet; Refill: 0  2. Elevated  blood pressure reading  - TSH - COMPLETE METABOLIC PANEL WITH GFR - atenolol (TENORMIN) 25 MG tablet; Take 1 tablet (25 mg total) by mouth daily.  Dispense: 30 tablet; Refill: 0  3. Iron deficiency anemia due to chronic blood loss   4. Dysthymia  - escitalopram (LEXAPRO) 5 MG tablet; Take 1-2 tablets (5-10 mg total) by mouth daily. Start 1 pill daily and after one week try going up to 2 pills  Dispense: 60 tablet; Refill: 0  5. Anxiety  - hydrOXYzine (ATARAX/VISTARIL) 10 MG tablet; Take 1 tablet (10 mg total) by mouth 3 (three) times daily as needed.  Dispense: 30 tablet; Refill: 0 - escitalopram (LEXAPRO) 5 MG tablet; Take 1-2 tablets (5-10 mg total) by mouth daily. Start 1 pill daily and after one week try going up to 2 pills  Dispense: 60 tablet; Refill: 0

## 2020-07-18 LAB — COMPLETE METABOLIC PANEL WITH GFR
AG Ratio: 1.3 (calc) (ref 1.0–2.5)
ALT: 10 U/L (ref 6–29)
AST: 15 U/L (ref 10–30)
Albumin: 4 g/dL (ref 3.6–5.1)
Alkaline phosphatase (APISO): 63 U/L (ref 31–125)
BUN: 8 mg/dL (ref 7–25)
CO2: 23 mmol/L (ref 20–32)
Calcium: 9.3 mg/dL (ref 8.6–10.2)
Chloride: 105 mmol/L (ref 98–110)
Creat: 0.85 mg/dL (ref 0.50–1.10)
GFR, Est African American: 102 mL/min/{1.73_m2} (ref >=60)
GFR, Est Non African American: 88 mL/min/{1.73_m2} (ref >=60)
Globulin: 3.1 g/dL (calc) (ref 1.9–3.7)
Glucose, Bld: 92 mg/dL (ref 65–99)
Potassium: 4.6 mmol/L (ref 3.5–5.3)
Sodium: 139 mmol/L (ref 135–146)
Total Bilirubin: 0.3 mg/dL (ref 0.2–1.2)
Total Protein: 7.1 g/dL (ref 6.1–8.1)

## 2020-07-18 LAB — TSH: TSH: 1.98 mIU/L

## 2020-07-27 ENCOUNTER — Ambulatory Visit (INDEPENDENT_AMBULATORY_CARE_PROVIDER_SITE_OTHER): Payer: BC Managed Care – PPO

## 2020-07-27 ENCOUNTER — Other Ambulatory Visit: Payer: Self-pay

## 2020-07-27 DIAGNOSIS — Z23 Encounter for immunization: Secondary | ICD-10-CM

## 2020-07-31 NOTE — Progress Notes (Signed)
Name: Carla Cox   MRN: [id number]    DOB: [birth year]   Date:08/07/2020       Progress Note  Subjective  Chief Complaint  Follow up  HPI  Depression/Anxiety: she was treated for similar symptoms  in her mid 20's when she went through a divorce. She has two children from her previous marriage a 36 yo and 36 yo son. They used to live with their father in [place] but in May 2020 - in the middle of the pandemic - they move in with her because their father was going through a divorce at the time. Her oldest son has been anxious for a while, however in Nov her youngest son 72(11 yo) had a seizure - while in the back seat of father's car . Oldest son , Fayrene FearingJames, was more anxious since the seizure episode, he is doing well as long as he can be around his brother - she is worried he will worry again once they go back to school. Since we started her on Lexapro she is feeling better, she still worries about him being on the bus and going to school, she also worries when they are not home with her but in [place] with their father. She is doing better, only taking hydroxyzine every few days. She states palpitation only with anxiety episodes.  Tachycardia/: doing better on beta-blocker, no side effects. Likely triggered by increase in anxiety and we will monitor   Iron deficiency anemia: getting iron infusion, she cannot tell any improvement since last infusion early December.    Patient Active Problem List   Diagnosis Date Noted  . CIN II (cervical intraepithelial neoplasia II) 06/29/2018  . 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 Iron deficiency anemia 03/31/2018  . Obesity (BMI 30.0-34.9) 03/25/2018  . Eczema 03/25/2018    Past Surgical History:  Procedure Laterality Date  . CESAREAN SECTION  2008   c-section  . TUBAL LIGATION  12/2008    Family History  Problem Relation Age of Onset  . Hypertension Mother   . Diabetes Father   . Hypertension  Other   . Diabetes Other   . Diabetes Maternal Grandmother   . Hypertension Paternal Grandmother   . Diabetes Paternal Grandmother   . Heart attack Paternal Grandfather   . Depression Son   . Anxiety disorder Son   . Seizures Son   . Breast cancer Maternal Aunt   . Breast cancer Paternal Aunt     Social History   Tobacco Use  . Smoking status: Never Smoker  . Smokeless tobacco: Never Used  Substance Use Topics  . Alcohol use: Yes    Comment: occasionally     Current Outpatient Medications:  .  atenolol (TENORMIN) 25 MG tablet, Take 1 tablet (25 mg total) by mouth daily., Disp: 30 tablet, Rfl: 0 .  BIOTIN W/ VITAMINS C & E PO, Take by mouth., Disp: , Rfl:  .  escitalopram (LEXAPRO) 5 MG tablet, Take 1-2 tablets (5-10 mg total) by mouth daily. Start 1 pill daily and after one week try going up to 2 pills, Disp: 60 tablet, Rfl: 0 .  ferrous sulfate 325 (65 FE) MG tablet, Take 325 mg by mouth daily with breakfast., Disp: , Rfl:  .  hydrOXYzine (ATARAX/VISTARIL) 10 MG tablet, Take 1 tablet (10 mg total) by mouth 3 (three) times daily as needed., Disp: 30 tablet, Rfl: 0 .  norgestimate-ethinyl estradiol (SPRINTEC 28) 0.25-35 MG-MCG tablet, Take  1 tablet by mouth daily., Disp: 84 tablet, Rfl: 3  No Known Allergies  I personally reviewed active problem list, medication list, allergies, family history, social history, health maintenance with the patient/caregiver today.   ROS  Constitutional: Negative for fever or weight change.  Respiratory: Negative for cough and shortness of breath.   Cardiovascular: Negative for chest pain or palpitations.  Gastrointestinal: Negative for abdominal pain, no bowel changes.  Musculoskeletal: Negative for gait problem or joint swelling.  Skin: Negative for rash.  Neurological: Negative for dizziness or headache.  No other specific complaints in a complete review of systems (except as listed in HPI above).  Objective  Vitals:   08/07/20 1351   BP: 132/84  Pulse: 100  Resp: 17  Temp: 98 F (36.7 C)  TempSrc: Oral  SpO2: 99%  Weight: 203 lb 14.4 oz (92.5 kg)  Height: 5\' 4"  (1.626 m)    Body mass index is 35 kg/m.  Physical Exam  Constitutional: Patient appears well-developed and well-nourished. Obese  No distress.  HEENT: head atraumatic, normocephalic, pupils equal and reactive to light,  neck supple Cardiovascular: Normal rate, regular rhythm and normal heart sounds.  No murmur heard. No BLE edema. Pulmonary/Chest: Effort normal and breath sounds normal. No respiratory distress. Abdominal: Soft.  There is no tenderness. Psychiatric: Patient has a normal mood and affect. behavior is normal. Judgment and thought content normal.  Recent Results (from the past 2160 hour(s))  COMPLETE METABOLIC PANEL WITH GFR     Status: None   Collection Time: 05/25/20  8:34 AM  Result Value Ref Range   Glucose, Bld 87 65 - 99 mg/dL    Comment: .            Fasting reference interval .    BUN 9 7 - 25 mg/dL   Creat 05/27/20 6.96 - 7.89 mg/dL   GFR, Est Non African American 83 > OR = 60 mL/min/1.66m2   GFR, Est African American 97 > OR = 60 mL/min/1.87m2   BUN/Creatinine Ratio NOT APPLICABLE 6 - 22 (calc)   Sodium 139 135 - 146 mmol/L   Potassium 4.9 3.5 - 5.3 mmol/L   Chloride 106 98 - 110 mmol/L   CO2 26 20 - 32 mmol/L   Calcium 9.4 8.6 - 10.2 mg/dL   Total Protein 7.0 6.1 - 8.1 g/dL   Albumin 4.0 3.6 - 5.1 g/dL   Globulin 3.0 1.9 - 3.7 g/dL (calc)   AG Ratio 1.3 1.0 - 2.5 (calc)   Total Bilirubin 0.2 0.2 - 1.2 mg/dL   Alkaline phosphatase (APISO) 68 31 - 125 U/L   AST 18 10 - 30 U/L   ALT 16 6 - 29 U/L  CBC with Differential/Platelet     Status: Abnormal   Collection Time: 05/25/20  8:34 AM  Result Value Ref Range   WBC 7.6 3.8 - 10.8 Thousand/uL   RBC 4.00 3.80 - 5.10 Million/uL   Hemoglobin 10.8 (L) 11.7 - 15.5 g/dL   HCT 05/27/20 (L) 01.7 - 51.0 %   MCV 85.8 80.0 - 100.0 fL   MCH 27.0 27.0 - 33.0 pg   MCHC 31.5 (L)  32.0 - 36.0 g/dL   RDW 25.8 52.7 - 78.2 %   Platelets 461 (H) 140 - 400 Thousand/uL   MPV 10.2 7.5 - 12.5 fL   Neutro Abs 5,183 1,500 - 7,800 cells/uL   Lymphs Abs 1,657 850 - 3,900 cells/uL   Absolute Monocytes 555 200 - 950 cells/uL  Eosinophils Absolute 152 15 - 500 cells/uL   Basophils Absolute 53 0 - 200 cells/uL   Neutrophils Relative % 68.2 %   Total Lymphocyte 21.8 %   Monocytes Relative 7.3 %   Eosinophils Relative 2.0 %   Basophils Relative 0.7 %  Iron, TIBC and Ferritin Panel     Status: Abnormal   Collection Time: 05/25/20  8:34 AM  Result Value Ref Range   Iron 20 (L) 40 - 190 mcg/dL   TIBC 604 540 - 981 mcg/dL (calc)   %SAT 5 (L) 16 - 45 % (calc)   Ferritin 15 (L) 16 - 154 ng/mL  Iron and TIBC     Status: None   Collection Time: 06/19/20  1:26 PM  Result Value Ref Range   Iron 54 28 - 170 ug/dL   TIBC 191 478 - 295 ug/dL   Saturation Ratios 13 10.4 - 31.8 %   UIBC 362 ug/dL    Comment: Performed at [facility], [address] [place], [place] [address]  Ferritin     Status: None   Collection Time: 06/19/20  1:26 PM  Result Value Ref Range   Ferritin 12 11 - 307 ng/mL    Comment: Performed at [facility], [address] [place], [place] [address]  CBC with Differential/Platelet     Status: Abnormal   Collection Time: 06/19/20  1:26 PM  Result Value Ref Range   WBC 8.6 4.0 - 10.5 K/uL   RBC 4.12 3.87 - 5.11 MIL/uL   Hemoglobin 11.4 (L) 12.0 - 15.0 g/dL   HCT 46.9 (L) 62.9 - 52.8 %   MCV 84.5 80.0 - 100.0 fL   MCH 27.7 26.0 - 34.0 pg   MCHC 32.8 30.0 - 36.0 g/dL   RDW 41.3 (H) 24.4 - 01.0 %   Platelets 419 (H) 150 - 400 K/uL   nRBC 0.0 0.0 - 0.2 %   Neutrophils Relative % 71 %   Neutro Abs 6.1 1.7 - 7.7 K/uL   Lymphocytes Relative 20 %   Lymphs Abs 1.8 0.7 - 4.0 K/uL   Monocytes Relative 6 %   Monocytes Absolute 0.5 0.1 - 1.0 K/uL   Eosinophils Relative 2 %   Eosinophils Absolute 0.1 0.0 - 0.5 K/uL   Basophils Relative 1 %    Basophils Absolute 0.0 0.0 - 0.1 K/uL   Immature Granulocytes 0 %   Abs Immature Granulocytes 0.03 0.00 - 0.07 K/uL    Comment: Performed at [facility], [address] [place], [place] [address]  TSH     Status: None   Collection Time: 07/17/20  8:20 AM  Result Value Ref Range   TSH 1.98 mIU/L    Comment:           Reference Range .           > or = 20 Years  0.40-4.50 .                Pregnancy Ranges           First trimester    0.26-2.66           Second trimester   0.55-2.73           Third trimester    0.43-2.91   COMPLETE METABOLIC PANEL WITH GFR     Status: None   Collection Time: 07/17/20  8:20 AM  Result Value Ref Range   Glucose, Bld 92 65 - 99 mg/dL  Comment: .            Fasting reference interval .    BUN 8 7 - 25 mg/dL   Creat 1.44 8.18 - 5.63 mg/dL   GFR, Est Non African American 88 > OR = 60 mL/min/1.50m2   GFR, Est African American 102 > OR = 60 mL/min/1.56m2   BUN/Creatinine Ratio NOT APPLICABLE 6 - 22 (calc)   Sodium 139 135 - 146 mmol/L   Potassium 4.6 3.5 - 5.3 mmol/L   Chloride 105 98 - 110 mmol/L   CO2 23 20 - 32 mmol/L   Calcium 9.3 8.6 - 10.2 mg/dL   Total Protein 7.1 6.1 - 8.1 g/dL   Albumin 4.0 3.6 - 5.1 g/dL   Globulin 3.1 1.9 - 3.7 g/dL (calc)   AG Ratio 1.3 1.0 - 2.5 (calc)   Total Bilirubin 0.3 0.2 - 1.2 mg/dL   Alkaline phosphatase (APISO) 63 31 - 125 U/L   AST 15 10 - 30 U/L   ALT 10 6 - 29 U/L     PHQ2/9: Depression screen [facility] 2/9 08/07/2020 07/17/2020 05/25/2020 05/25/2019 05/21/2018  Decreased Interest 1 1 1  0 0  Down, Depressed, Hopeless 0 3 0 0 0  PHQ - 2 Score 1 4 1  0 0  Altered sleeping 0 2 0 0 0  Tired, decreased energy 0 2 1 0 0  Change in appetite 0 1 0 0 0  Feeling bad or failure about yourself  0 3 0 0 0  Trouble concentrating 1 1 0 1 0  Moving slowly or fidgety/restless 0 2 0 0 0  Suicidal thoughts 0 0 0 0 0  PHQ-9 Score 2 15 2 1  0  Difficult doing work/chores - - Not difficult at all Not difficult at  all -    phq 9 is positive  GAD 7 : Generalized Anxiety Score 08/07/2020 07/17/2020 05/21/2018 03/25/2018  Nervous, Anxious, on Edge 1 2 0 0  Control/stop worrying 0 3 0 0  Worry too much - different things 0 3 0 0  Trouble relaxing 1 3 0 0  Restless 0 1 0 0  Easily annoyed or irritable 0 2 0 1  Afraid - awful might happen 1 3 0 0  Total GAD 7 Score 3 17 0 1  Anxiety Difficulty Somewhat difficult Somewhat difficult Not difficult at all Not difficult at all     Fall Risk: Fall Risk  08/07/2020 07/17/2020 05/25/2020 05/25/2019 05/21/2018  Falls in the past year? 0 0 0 0 No  Number falls in past yr: 0 0 0 0 -  Injury with Fall? 0 0 0 0 -    Functional Status Survey: Is the patient deaf or have difficulty hearing?: No Does the patient have difficulty seeing, even when wearing glasses/contacts?: No Does the patient have difficulty concentrating, remembering, or making decisions?: No Does the patient have difficulty walking or climbing stairs?: No Does the patient have difficulty dressing or bathing?: No Does the patient have difficulty doing errands alone such as visiting a doctor's office or shopping?: No    Assessment & Plan  1. Tachycardia  - atenolol (TENORMIN) 25 MG tablet; Take 1 tablet (25 mg total) by mouth daily.  Dispense: 90 tablet; Refill: 0  2. Dysthymia  - escitalopram (LEXAPRO) 10 MG tablet; Take 1 tablet (10 mg total) by mouth daily. Start 1 pill daily and after one week try going up to 2 pills  Dispense: 90 tablet; Refill: 0  3.  Anxiety  - escitalopram (LEXAPRO) 10 MG tablet; Take 1 tablet (10 mg total) by mouth daily. Start 1 pill daily and after one week try going up to 2 pills  Dispense: 90 tablet; Refill: 0  4. Iron deficiency anemia due to chronic blood loss   5. Elevated blood pressure reading  - atenolol (TENORMIN) 25 MG tablet; Take 1 tablet (25 mg total) by mouth daily.  Dispense: 90 tablet; Refill: 0

## 2020-08-07 ENCOUNTER — Encounter: Payer: Self-pay | Admitting: Family Medicine

## 2020-08-07 ENCOUNTER — Ambulatory Visit (INDEPENDENT_AMBULATORY_CARE_PROVIDER_SITE_OTHER): Payer: BC Managed Care – PPO | Admitting: Family Medicine

## 2020-08-07 ENCOUNTER — Other Ambulatory Visit: Payer: Self-pay

## 2020-08-07 VITALS — BP 132/84 | HR 100 | Temp 98.0°F | Resp 17 | Ht 64.0 in | Wt 203.9 lb

## 2020-08-07 DIAGNOSIS — D500 Iron deficiency anemia secondary to blood loss (chronic): Secondary | ICD-10-CM

## 2020-08-07 DIAGNOSIS — R030 Elevated blood-pressure reading, without diagnosis of hypertension: Secondary | ICD-10-CM

## 2020-08-07 DIAGNOSIS — R000 Tachycardia, unspecified: Secondary | ICD-10-CM

## 2020-08-07 DIAGNOSIS — F419 Anxiety disorder, unspecified: Secondary | ICD-10-CM | POA: Diagnosis not present

## 2020-08-07 DIAGNOSIS — F341 Dysthymic disorder: Secondary | ICD-10-CM

## 2020-08-07 MED ORDER — ESCITALOPRAM OXALATE 10 MG PO TABS: 10.0000 mg | ORAL_TABLET | Freq: Every day | ORAL | 0 refills | Status: DC

## 2020-08-07 MED ORDER — ATENOLOL 25 MG PO TABS: 25.0000 mg | ORAL_TABLET | Freq: Every day | ORAL | 0 refills | Status: DC

## 2020-09-07 ENCOUNTER — Other Ambulatory Visit: Payer: Self-pay

## 2020-09-07 DIAGNOSIS — D500 Iron deficiency anemia secondary to blood loss (chronic): Secondary | ICD-10-CM

## 2020-09-08 ENCOUNTER — Inpatient Hospital Stay: Payer: BC Managed Care – PPO | Attending: Oncology

## 2020-09-08 ENCOUNTER — Other Ambulatory Visit: Payer: BC Managed Care – PPO

## 2020-09-08 DIAGNOSIS — D509 Iron deficiency anemia, unspecified: Secondary | ICD-10-CM | POA: Insufficient documentation

## 2020-09-08 DIAGNOSIS — D500 Iron deficiency anemia secondary to blood loss (chronic): Secondary | ICD-10-CM

## 2020-09-08 LAB — CBC WITH DIFFERENTIAL/PLATELET
Abs Immature Granulocytes: 0.03 10*3/uL (ref 0.00–0.07)
Basophils Absolute: 0 10*3/uL (ref 0.0–0.1)
Basophils Relative: 0 %
Eosinophils Absolute: 0.1 10*3/uL (ref 0.0–0.5)
Eosinophils Relative: 1 %
HCT: 37.5 % (ref 36.0–46.0)
Hemoglobin: 13 g/dL (ref 12.0–15.0)
Immature Granulocytes: 0 %
Lymphocytes Relative: 25 %
Lymphs Abs: 2.2 10*3/uL (ref 0.7–4.0)
MCH: 29.8 pg (ref 26.0–34.0)
MCHC: 34.7 g/dL (ref 30.0–36.0)
MCV: 86 fL (ref 80.0–100.0)
Monocytes Absolute: 0.5 10*3/uL (ref 0.1–1.0)
Monocytes Relative: 6 %
Neutro Abs: 6.1 10*3/uL (ref 1.7–7.7)
Neutrophils Relative %: 68 %
Platelets: 357 10*3/uL (ref 150–400)
RBC: 4.36 MIL/uL (ref 3.87–5.11)
RDW: 14.4 % (ref 11.5–15.5)
WBC: 9 10*3/uL (ref 4.0–10.5)
nRBC: 0 % (ref 0.0–0.2)

## 2020-09-08 LAB — FERRITIN: Ferritin: 69 ng/mL (ref 11–307)

## 2020-09-08 LAB — IRON AND TIBC
Iron: 97 ug/dL (ref 28–170)
Saturation Ratios: 24 % (ref 10.4–31.8)
TIBC: 398 ug/dL (ref 250–450)
UIBC: 301 ug/dL

## 2020-09-11 ENCOUNTER — Inpatient Hospital Stay (HOSPITAL_BASED_OUTPATIENT_CLINIC_OR_DEPARTMENT_OTHER): Payer: BC Managed Care – PPO | Admitting: Oncology

## 2020-09-11 ENCOUNTER — Encounter: Payer: Self-pay | Admitting: Oncology

## 2020-09-11 DIAGNOSIS — N920 Excessive and frequent menstruation with regular cycle: Secondary | ICD-10-CM | POA: Diagnosis not present

## 2020-09-11 DIAGNOSIS — D500 Iron deficiency anemia secondary to blood loss (chronic): Secondary | ICD-10-CM

## 2020-09-11 NOTE — Progress Notes (Signed)
HEMATOLOGY-ONCOLOGY TeleHEALTH VISIT PROGRESS NOTE  I connected with Carla Cox on 09/11/20  at  2:30 PM EST by video enabled telemedicine visit and verified that I am speaking with the correct person using two identifiers. I discussed the limitations, risks, security and privacy concerns of performing an evaluation and management service by telemedicine and the availability of in-person appointments. The patient expressed understanding and agreed to proceed.   Other persons participating in the visit and their role in the encounter:  None  Patient's location: Home  Provider's location: office Chief Complaint:  Iron deficiency anemia  INTERVAL HISTORY Carla Cox is a 35 y.o. female who has above history reviewed by me today presents for follow up visit for management of iron deficiency anemia Problems and complaints are listed below:  Patient has no new complaints.  She tolerated IV Venofer treatments.  Reports the fatigue has significantly improved. Review of Systems  Constitutional: Negative for appetite change, chills, fatigue and fever.  HENT:   Negative for hearing loss and voice change.   Eyes: Negative for eye problems.  Respiratory: Negative for chest tightness and cough.   Cardiovascular: Negative for chest pain.  Gastrointestinal: Negative for abdominal distention, abdominal pain and blood in stool.  Endocrine: Negative for hot flashes.  Genitourinary: Negative for difficulty urinating and frequency.   Musculoskeletal: Negative for arthralgias.  Skin: Negative for itching and rash.  Neurological: Negative for extremity weakness.  Hematological: Negative for adenopathy.  Psychiatric/Behavioral: Negative for confusion.    Past Medical History:  Diagnosis Date  . Anemia   . Anxiety    Past Surgical History:  Procedure Laterality Date  . CESAREAN SECTION  2008   c-section  . TUBAL LIGATION  12/2008    Family History  Problem Relation Age of Onset  .  Hypertension Mother   . Diabetes Father   . Hypertension Other   . Diabetes Other   . Diabetes Maternal Grandmother   . Hypertension Paternal Grandmother   . Diabetes Paternal Grandmother   . Heart attack Paternal Grandfather   . Depression Son   . Anxiety disorder Son   . Seizures Son   . Breast cancer Maternal Aunt   . Breast cancer Paternal Aunt     Social History   Socioeconomic History  . Marital status: Divorced    Spouse name: Not on file  . Number of children: 2  . Years of education: Not on file  . Highest education level: Bachelor's degree (e.g., BA, AB, BS)  Occupational History  . Occupation: Airline pilot   Tobacco Use  . Smoking status: Never Smoker  . Smokeless tobacco: Never Used  Vaping Use  . Vaping Use: Never used  Substance and Sexual Activity  . Alcohol use: Yes    Comment: occasionally  . Drug use: No  . Sexual activity: Yes    Partners: Male    Birth control/protection: Other-see comments    Comment: Tubal Ligation  Other Topics Concern  . Not on file  Social History Narrative   Divorced since age 25 , has two sons - they live with father in [place] - better school district, but spend [place] and weekends with mom       She lives with fiance since 2016   Social Determinants of Health   Financial Resource Strain: Low Risk   . Difficulty of Paying Living Expenses: Not hard at all  Food Insecurity: No Food Insecurity  . Worried About Programme researcher, broadcasting/film/video in the  Last Year: Never true  . Ran Out of Food in the Last Year: Never true  Transportation Needs: No Transportation Needs  . Lack of Transportation (Medical): No  . Lack of Transportation (Non-Medical): No  Physical Activity: Insufficiently Active  . Days of Exercise per Week: 3 days  . Minutes of Exercise per Session: 40 min  Stress: Stress Concern Present  . Feeling of Stress : To some extent  Social Connections: Moderately Isolated  . Frequency of Communication with Friends and Family:  More than three times a week  . Frequency of Social Gatherings with Friends and Family: Once a week  . Attends Religious Services: Never  . Active Member of Clubs or Organizations: No  . Attends Banker Meetings: Never  . Marital Status: Living with partner  Intimate Partner Violence: Not At Risk  . Fear of Current or Ex-Partner: No  . Emotionally Abused: No  . Physically Abused: No  . Sexually Abused: No    Current Outpatient Medications on File Prior to Visit  Medication Sig Dispense Refill  . atenolol (TENORMIN) 25 MG tablet Take 1 tablet (25 mg total) by mouth daily. 90 tablet 0  . BIOTIN W/ VITAMINS C & E PO Take by mouth.    . escitalopram (LEXAPRO) 10 MG tablet Take 1 tablet (10 mg total) by mouth daily. Start 1 pill daily and after one week try going up to 2 pills 90 tablet 0  . ferrous sulfate 325 (65 FE) MG tablet Take 325 mg by mouth daily with breakfast.    . hydrOXYzine (ATARAX/VISTARIL) 10 MG tablet Take 1 tablet (10 mg total) by mouth 3 (three) times daily as needed. 30 tablet 0  . norgestimate-ethinyl estradiol (SPRINTEC 28) 0.25-35 MG-MCG tablet Take 1 tablet by mouth daily. 84 tablet 3   No current facility-administered medications on file prior to visit.    No Known Allergies     Observations/Objective: There were no vitals filed for this visit. There is no height or weight on file to calculate BMI.  Physical Exam Neurological:     Mental Status: She is alert.     CBC    Component Value Date/Time   WBC 9.0 09/08/2020 1356   RBC 4.36 09/08/2020 1356   HGB 13.0 09/08/2020 1356   HCT 37.5 09/08/2020 1356   PLT 357 09/08/2020 1356   MCV 86.0 09/08/2020 1356   MCH 29.8 09/08/2020 1356   MCHC 34.7 09/08/2020 1356   RDW 14.4 09/08/2020 1356   LYMPHSABS 2.2 09/08/2020 1356   MONOABS 0.5 09/08/2020 1356   EOSABS 0.1 09/08/2020 1356   BASOSABS 0.0 09/08/2020 1356    CMP     Component Value Date/Time   NA 139 07/17/2020 0820   K 4.6  07/17/2020 0820   CL 105 07/17/2020 0820   CO2 23 07/17/2020 0820   GLUCOSE 92 07/17/2020 0820   BUN 8 07/17/2020 0820   CREATININE 0.85 07/17/2020 0820   CALCIUM 9.3 07/17/2020 0820   PROT 7.1 07/17/2020 0820   ALBUMIN 3.9 05/08/2008 0503   AST 15 07/17/2020 0820   ALT 10 07/17/2020 0820   ALKPHOS 79 05/08/2008 0503   BILITOT 0.3 07/17/2020 0820   GFRNONAA 88 07/17/2020 0820   GFRAA 102 07/17/2020 0820     Assessment and Plan: 1. Iron deficiency anemia due to chronic blood loss   2. Menorrhagia with regular cycle     #Iron deficiency anemia, status post IV Venofer treatments.  Labs reviewed and  discussed with patient. Improved iron panel and hemoglobin level.  Hold off additional IV Venofer treatment at this point. Recommend patient to take ferrous sulfate once daily as maintenance.  She agrees with the plan.  We will repeat blood work in 6 months to ensure stability of hemoglobin and iron store.    Follow Up Instructions: 6 months   I discussed the assessment and treatment plan with the patient. The patient was provided an opportunity to ask questions and all were answered. The patient agreed with the plan and demonstrated an understanding of the instructions.  The patient was advised to call back or seek an in-person evaluation if the symptoms worsen or if the condition fails to improve as anticipated.    Rickard Patience, MD 09/11/2020 3:52 PM

## 2020-09-11 NOTE — Progress Notes (Signed)
Patient contacted for Mychart visit. No new concerns voiced.  

## 2020-09-12 ENCOUNTER — Inpatient Hospital Stay: Payer: BC Managed Care – PPO

## 2020-11-03 NOTE — Progress Notes (Signed)
Name: Carla Cox   MRN: [id number]    DOB: 07-23-1986   Date:11/06/2020       Progress Note  Subjective  Chief Complaint  Follow up   HPI   Depression/Anxiety: she was treated for similar symptoms  in her mid 20's when she went through a divorce. She has two children from her previous marriage a 37 yo and 59 yo son. They used to live with their father in [place] but in May 2020 - in the middle of the pandemic - they move in with her because their father was going through a divorce at the time. Her oldest son has been anxious for a while, however in Nov her youngest son (13 yo) had a seizure - while in the back seat of father's car . Oldest son , Fayrene Fearing, was more anxious since the seizure episode, he is doing well as long as he can be around his brother . Kids are back to school, playing sports ( running track) and no recent seizure episodes. She is feeling less stressed since no recent seizure episodes.  Tachycardia/: doing better on beta-blocker, no side effects. Likely triggered by increase in anxiety. She states since started on Atenolol bp has normalized, states at home around 120's/80's . She denies any palpitation    Patient Active Problem List   Diagnosis Date Noted  . CIN II (cervical intraepithelial neoplasia II) 06/29/2018  . 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 Iron deficiency anemia 03/31/2018  . Obesity (BMI 30.0-34.9) 03/25/2018  . Eczema 03/25/2018    Past Surgical History:  Procedure Laterality Date  . CESAREAN SECTION  2008   c-section  . TUBAL LIGATION  12/2008    Family History  Problem Relation Age of Onset  . Hypertension Mother   . Diabetes Father   . Hypertension Other   . Diabetes Other   . Diabetes Maternal Grandmother   . Hypertension Paternal Grandmother   . Diabetes Paternal Grandmother   . Heart attack Paternal Grandfather   . Depression Son   . Anxiety disorder Son   . Seizures Son   .  Breast cancer Maternal Aunt   . Breast cancer Paternal Aunt     Social History   Tobacco Use  . Smoking status: Never Smoker  . Smokeless tobacco: Never Used  Substance Use Topics  . Alcohol use: Yes    Comment: occasionally     Current Outpatient Medications:  .  atenolol (TENORMIN) 25 MG tablet, TAKE 1 TABLET (25 MG TOTAL) BY MOUTH DAILY., Disp: 90 tablet, Rfl: 0 .  BIOTIN W/ VITAMINS C & E PO, Take by mouth., Disp: , Rfl:  .  escitalopram (LEXAPRO) 10 MG tablet, Take 1 tablet (10 mg total) by mouth daily. Start 1 pill daily and after one week try going up to 2 pills, Disp: 90 tablet, Rfl: 0 .  ferrous sulfate 325 (65 FE) MG tablet, Take 325 mg by mouth daily with breakfast., Disp: , Rfl:  .  hydrOXYzine (ATARAX/VISTARIL) 10 MG tablet, Take 1 tablet (10 mg total) by mouth 3 (three) times daily as needed., Disp: 30 tablet, Rfl: 0 .  norgestimate-ethinyl estradiol (SPRINTEC 28) 0.25-35 MG-MCG tablet, Take 1 tablet by mouth daily., Disp: 84 tablet, Rfl: 3  No Known Allergies  I personally reviewed active problem list, medication list, allergies, family history, social history with the patient/caregiver today.   ROS  Constitutional: Negative for fever or weight change.  Respiratory:  Negative for cough and shortness of breath.   Cardiovascular: Negative for chest pain or palpitations.  Gastrointestinal: Negative for abdominal pain, no bowel changes.  Musculoskeletal: Negative for gait problem or joint swelling.  Skin: Negative for rash.  Neurological: Negative for dizziness or headache.  No other specific complaints in a complete review of systems (except as listed in HPI above).  Objective  Vitals:   11/06/20 0943  BP: 132/80  Pulse: 99  Resp: 16  Temp: 98.7 F (37.1 C)  TempSrc: Oral  SpO2: 97%  Weight: 201 lb (91.2 kg)  Height: 5\' 4"  (1.626 m)    Body mass index is 34.5 kg/m.  Physical Exam  Constitutional: Patient appears well-developed and well-nourished.  Obese  No distress.  HEENT: head atraumatic, normocephalic, pupils equal and reactive to light, neck supple Cardiovascular: Normal rate, regular rhythm and normal heart sounds.  No murmur heard. No BLE edema. Pulmonary/Chest: Effort normal and breath sounds normal. No respiratory distress. Abdominal: Soft.  There is no tenderness. Psychiatric: Patient has a normal mood and affect. behavior is normal. Judgment and thought content normal.  Recent Results (from the past 2160 hour(s))  Ferritin     Status: None   Collection Time: 09/08/20  1:56 PM  Result Value Ref Range   Ferritin 69 11 - 307 ng/mL    Comment: Performed at [facility], [address] [place], [place]  Iron and TIBC     Status: None   Collection Time: 09/08/20  1:56 PM  Result Value Ref Range   Iron 97 28 - 170 ug/dL   TIBC 11/06/20 166 - 063 ug/dL   Saturation Ratios 24 10.4 - 31.8 %   UIBC 301 ug/dL    Comment: Performed at [facility], [address] [place], [place]  CBC with Differential/Platelet     Status: None   Collection Time: 09/08/20  1:56 PM  Result Value Ref Range   WBC 9.0 4.0 - 10.5 K/uL   RBC 4.36 3.87 - 5.11 MIL/uL   Hemoglobin 13.0 12.0 - 15.0 g/dL   HCT 11/06/20 23.5 - 57.3 %   MCV 86.0 80.0 - 100.0 fL   MCH 29.8 26.0 - 34.0 pg   MCHC 34.7 30.0 - 36.0 g/dL   RDW 22.0 25.4 - 27.0 %   Platelets 357 150 - 400 K/uL   nRBC 0.0 0.0 - 0.2 %   Neutrophils Relative % 68 %   Neutro Abs 6.1 1.7 - 7.7 K/uL   Lymphocytes Relative 25 %   Lymphs Abs 2.2 0.7 - 4.0 K/uL   Monocytes Relative 6 %   Monocytes Absolute 0.5 0.1 - 1.0 K/uL   Eosinophils Relative 1 %   Eosinophils Absolute 0.1 0.0 - 0.5 K/uL   Basophils Relative 0 %   Basophils Absolute 0.0 0.0 - 0.1 K/uL   Immature Granulocytes 0 %   Abs Immature Granulocytes 0.03 0.00 - 0.07 K/uL    Comment: Performed at [facility], [address] [place], [place]      PHQ2/9: Depression screen [facility]  2/9 11/06/2020 08/07/2020 07/17/2020 05/25/2020 05/25/2019  Decreased Interest 0 1 1 1  0  Down, Depressed, Hopeless 0 0 3 0 0  PHQ - 2 Score 0 1 4 1  0  Altered sleeping 0 0 2 0 0  Tired, decreased energy 0 0 2 1 0  Change in appetite 1 0 1 0 0  Feeling bad or failure about yourself  0 0 3 0 0  Trouble concentrating 0 1 1 0 1  Moving slowly or fidgety/restless 0 0 2 0 0  Suicidal thoughts 0 0 0 0 0  PHQ-9 Score 1 2 15 2 1   Difficult doing work/chores - - - Not difficult at all Not difficult at all    phq 9 is negative GAD 7 : Generalized Anxiety Score 08/07/2020 07/17/2020 05/21/2018 03/25/2018  Nervous, Anxious, on Edge 1 2 0 0  Control/stop worrying 0 3 0 0  Worry too much - different things 0 3 0 0  Trouble relaxing 1 3 0 0  Restless 0 1 0 0  Easily annoyed or irritable 0 2 0 1  Afraid - awful might happen 1 3 0 0  Total GAD 7 Score 3 17 0 1  Anxiety Difficulty Somewhat difficult Somewhat difficult Not difficult at all Not difficult at all    Fall Risk: Fall Risk  11/06/2020 08/07/2020 07/17/2020 05/25/2020 05/25/2019  Falls in the past year? 0 0 0 0 0  Number falls in past yr: 0 0 0 0 0  Injury with Fall? 0 0 0 0 0     Functional Status Survey: Is the patient deaf or have difficulty hearing?: No Does the patient have difficulty seeing, even when wearing glasses/contacts?: No Does the patient have difficulty concentrating, remembering, or making decisions?: No Does the patient have difficulty walking or climbing stairs?: No Does the patient have difficulty dressing or bathing?: No Does the patient have difficulty doing errands alone such as visiting a doctor's office or shopping?: No   Assessment & Plan  1. Dysthymia  - escitalopram (LEXAPRO) 10 MG tablet; Take 1 tablet (10 mg total) by mouth daily. Start 1 pill daily and after one week try going up to 2 pills  Dispense: 90 tablet; Refill: 1  2. Anxiety  - escitalopram (LEXAPRO) 10 MG tablet; Take 1 tablet (10 mg total) by  mouth daily. Start 1 pill daily and after one week try going up to 2 pills  Dispense: 90 tablet; Refill: 1  3. Tachycardia  - atenolol (TENORMIN) 25 MG tablet; Take 1 tablet (25 mg total) by mouth daily.  Dispense: 90 tablet; Refill: 1  4. Elevated blood pressure reading  - atenolol (TENORMIN) 25 MG tablet; Take 1 tablet (25 mg total) by mouth daily.  Dispense: 90 tablet; Refill: 1

## 2020-11-06 ENCOUNTER — Encounter: Payer: Self-pay | Admitting: Family Medicine

## 2020-11-06 ENCOUNTER — Other Ambulatory Visit: Payer: Self-pay

## 2020-11-06 ENCOUNTER — Other Ambulatory Visit: Payer: Self-pay | Admitting: Family Medicine

## 2020-11-06 ENCOUNTER — Ambulatory Visit (INDEPENDENT_AMBULATORY_CARE_PROVIDER_SITE_OTHER): Payer: BC Managed Care – PPO | Admitting: Family Medicine

## 2020-11-06 DIAGNOSIS — R000 Tachycardia, unspecified: Secondary | ICD-10-CM | POA: Diagnosis not present

## 2020-11-06 DIAGNOSIS — F419 Anxiety disorder, unspecified: Secondary | ICD-10-CM | POA: Diagnosis not present

## 2020-11-06 DIAGNOSIS — F341 Dysthymic disorder: Secondary | ICD-10-CM

## 2020-11-06 DIAGNOSIS — R030 Elevated blood-pressure reading, without diagnosis of hypertension: Secondary | ICD-10-CM

## 2020-11-06 MED ORDER — ATENOLOL 25 MG PO TABS: 25.0000 mg | ORAL_TABLET | Freq: Every day | ORAL | 1 refills | Status: AC

## 2020-11-06 MED ORDER — ESCITALOPRAM OXALATE 10 MG PO TABS: 10.0000 mg | ORAL_TABLET | Freq: Every day | ORAL | 1 refills | Status: AC

## 2020-11-27 ENCOUNTER — Ambulatory Visit (INDEPENDENT_AMBULATORY_CARE_PROVIDER_SITE_OTHER): Payer: BC Managed Care – PPO

## 2020-11-27 ENCOUNTER — Other Ambulatory Visit: Payer: Self-pay

## 2020-11-27 DIAGNOSIS — Z23 Encounter for immunization: Secondary | ICD-10-CM | POA: Diagnosis not present

## 2021-03-09 ENCOUNTER — Other Ambulatory Visit: Payer: Self-pay

## 2021-03-09 ENCOUNTER — Inpatient Hospital Stay: Payer: BC Managed Care – PPO | Attending: Oncology

## 2021-03-09 DIAGNOSIS — D500 Iron deficiency anemia secondary to blood loss (chronic): Secondary | ICD-10-CM | POA: Insufficient documentation

## 2021-03-09 LAB — IRON AND TIBC
Iron: 104 ug/dL (ref 28–170)
Saturation Ratios: 28 % (ref 10.4–31.8)
TIBC: 367 ug/dL (ref 250–450)
UIBC: 263 ug/dL

## 2021-03-09 LAB — CBC WITH DIFFERENTIAL/PLATELET
Abs Immature Granulocytes: 0.02 10*3/uL (ref 0.00–0.07)
Basophils Absolute: 0 10*3/uL (ref 0.0–0.1)
Basophils Relative: 1 %
Eosinophils Absolute: 0.2 10*3/uL (ref 0.0–0.5)
Eosinophils Relative: 2 %
HCT: 37.9 % (ref 36.0–46.0)
Hemoglobin: 12.7 g/dL (ref 12.0–15.0)
Immature Granulocytes: 0 %
Lymphocytes Relative: 24 %
Lymphs Abs: 2.1 10*3/uL (ref 0.7–4.0)
MCH: 29.6 pg (ref 26.0–34.0)
MCHC: 33.5 g/dL (ref 30.0–36.0)
MCV: 88.3 fL (ref 80.0–100.0)
Monocytes Absolute: 0.5 10*3/uL (ref 0.1–1.0)
Monocytes Relative: 5 %
Neutro Abs: 5.9 10*3/uL (ref 1.7–7.7)
Neutrophils Relative %: 68 %
Platelets: 348 10*3/uL (ref 150–400)
RBC: 4.29 MIL/uL (ref 3.87–5.11)
RDW: 13.4 % (ref 11.5–15.5)
WBC: 8.7 10*3/uL (ref 4.0–10.5)
nRBC: 0 % (ref 0.0–0.2)

## 2021-03-09 LAB — FERRITIN: Ferritin: 42 ng/mL (ref 11–307)

## 2021-03-12 ENCOUNTER — Encounter: Payer: Self-pay | Admitting: Oncology

## 2021-03-12 ENCOUNTER — Inpatient Hospital Stay (HOSPITAL_BASED_OUTPATIENT_CLINIC_OR_DEPARTMENT_OTHER): Payer: BC Managed Care – PPO | Admitting: Oncology

## 2021-03-12 DIAGNOSIS — D500 Iron deficiency anemia secondary to blood loss (chronic): Secondary | ICD-10-CM | POA: Diagnosis not present

## 2021-03-12 DIAGNOSIS — N920 Excessive and frequent menstruation with regular cycle: Secondary | ICD-10-CM | POA: Diagnosis not present

## 2021-03-12 NOTE — Progress Notes (Signed)
HEMATOLOGY-ONCOLOGY TeleHEALTH VISIT PROGRESS NOTE  I connected with [name] on 03/12/21  at  2:15 PM EDT by video enabled telemedicine visit and verified that I am speaking with the correct person using two identifiers. I discussed the limitations, risks, security and privacy concerns of performing an evaluation and management service by telemedicine and the availability of in-person appointments. The patient expressed understanding and agreed to proceed.   Other persons participating in the visit and their role in the encounter:  None  Patient's location: Home  Provider's location: office Chief Complaint:  Iron deficiency anemia  INTERVAL HISTORY Carla Cox is a 35 y.o. female who has above history reviewed by me today presents for follow up visit for management of iron deficiency anemia Problems and complaints are listed below:  Patient takes oral iron supplementation. Menstrual period still heavy, now more regular, every 4 weeks.  She is on birth control pills . Review of Systems  Constitutional:  Negative for appetite change, chills, fatigue and fever.  HENT:   Negative for hearing loss and voice change.   Eyes:  Negative for eye problems.  Respiratory:  Negative for chest tightness and cough.   Cardiovascular:  Negative for chest pain.  Gastrointestinal:  Negative for abdominal distention, abdominal pain and blood in stool.  Endocrine: Negative for hot flashes.  Genitourinary:  Negative for difficulty urinating and frequency.   Musculoskeletal:  Negative for arthralgias.  Skin:  Negative for itching and rash.  Neurological:  Negative for extremity weakness.  Hematological:  Negative for adenopathy.  Psychiatric/Behavioral:  Negative for confusion.    Past Medical History:  Diagnosis Date   Anemia    Anxiety    Past Surgical History:  Procedure Laterality Date   CESAREAN SECTION  2008   c-section   TUBAL LIGATION  12/2008    Family History  Problem  Relation Age of Onset   Hypertension Mother    Diabetes Father    Hypertension Other    Diabetes Other    Diabetes Maternal Grandmother    Hypertension Paternal Grandmother    Diabetes Paternal Grandmother    Heart attack Paternal Grandfather    Depression Son    Anxiety disorder Son    Seizures Son    Breast cancer Maternal Aunt    Breast cancer Paternal Aunt     Social History   Socioeconomic History   Marital status: Divorced    Spouse name: Not on file   Number of children: 2   Years of education: Not on file   Highest education level: Bachelor's degree (e.g., BA, AB, BS)  Occupational History   Occupation: accountant   Tobacco Use   Smoking status: Never   Smokeless tobacco: Never  Vaping Use   Vaping Use: Never used  Substance and Sexual Activity   Alcohol use: Yes    Comment: occasionally   Drug use: No   Sexual activity: Yes    Partners: Male    Birth control/protection: Other-see comments    Comment: Tubal Ligation  Other Topics Concern   Not on file  Social History Narrative   Divorced since age 46 , has two sons - they live with father in [place] - better school district, but spend [place] and weekends with mom       She lives with fiance since 2016   Social Determinants of Health   Financial Resource Strain: Low Risk    Difficulty of Paying Living Expenses: Not hard at [employer]  Insecurity: No Food Insecurity   Worried About Programme researcher, broadcasting/film/video in the Last Year: Never true   Ran Out of Food in the Last Year: Never true  Transportation Needs: No Transportation Needs   Lack of Transportation (Medical): No   Lack of Transportation (Non-Medical): No  Physical Activity: Insufficiently Active   Days of Exercise per Week: 3 days   Minutes of Exercise per Session: 40 min  Stress: Stress Concern Present   Feeling of Stress : To some extent  Social Connections: Moderately Isolated   Frequency of Communication with Friends and Family: More than three  times a week   Frequency of Social Gatherings with Friends and Family: Once a week   Attends Religious Services: Never   Database administrator or Organizations: No   Attends Engineer, structural: Never   Marital Status: Living with partner  Intimate Partner Violence: Not At Risk   Fear of Current or Ex-Partner: No   Emotionally Abused: No   Physically Abused: No   Sexually Abused: No    Current Outpatient Medications on File Prior to Visit  Medication Sig Dispense Refill   atenolol (TENORMIN) 25 MG tablet Take 1 tablet (25 mg total) by mouth daily. 90 tablet 1   BIOTIN W/ VITAMINS C & E PO Take by mouth.     escitalopram (LEXAPRO) 10 MG tablet Take 1 tablet (10 mg total) by mouth daily. Start 1 pill daily and after one week try going up to 2 pills 90 tablet 1   ferrous sulfate 325 (65 FE) MG tablet Take 325 mg by mouth daily with breakfast.     hydrOXYzine (ATARAX/VISTARIL) 10 MG tablet Take 1 tablet (10 mg total) by mouth 3 (three) times daily as needed. 30 tablet 0   norgestimate-ethinyl estradiol (SPRINTEC 28) 0.25-35 MG-MCG tablet Take 1 tablet by mouth daily. 84 tablet 3   No current facility-administered medications on file prior to visit.    No Known Allergies     Observations/Objective: Today's Vitals   03/12/21 1334  PainSc: 0-No pain   There is no height or weight on file to calculate BMI.  Physical Exam Neurological:     Mental Status: She is alert.    CBC    Component Value Date/Time   WBC 8.7 03/09/2021 1417   RBC 4.29 03/09/2021 1417   HGB 12.7 03/09/2021 1417   HCT 37.9 03/09/2021 1417   PLT 348 03/09/2021 1417   MCV 88.3 03/09/2021 1417   MCH 29.6 03/09/2021 1417   MCHC 33.5 03/09/2021 1417   RDW 13.4 03/09/2021 1417   LYMPHSABS 2.1 03/09/2021 1417   MONOABS 0.5 03/09/2021 1417   EOSABS 0.2 03/09/2021 1417   BASOSABS 0.0 03/09/2021 1417    CMP     Component Value Date/Time   NA 139 07/17/2020 0820   K 4.6 07/17/2020 0820   CL 105  07/17/2020 0820   CO2 23 07/17/2020 0820   GLUCOSE 92 07/17/2020 0820   BUN 8 07/17/2020 0820   CREATININE 0.85 07/17/2020 0820   CALCIUM 9.3 07/17/2020 0820   PROT 7.1 07/17/2020 0820   ALBUMIN 3.9 05/08/2008 0503   AST 15 07/17/2020 0820   ALT 10 07/17/2020 0820   ALKPHOS 79 05/08/2008 0503   BILITOT 0.3 07/17/2020 0820   GFRNONAA 88 07/17/2020 0820   GFRAA 102 07/17/2020 0820     Assessment and Plan: 1. Iron deficiency anemia due to chronic blood loss   2. Menorrhagia with regular  cycle     #Iron deficiency anemia,  Labs reviewed and discussed with patient. Iron labs are stable and hemoglobin remains normal. No need for IV Venofer treatments. Recommend patient continue oral iron supplementation.  Menorrhagia, recommend patient to continue follow-up with gynecology for further evaluation and management.  Follow Up Instructions: Follow-up as needed.  Patient prefers to follow-up with primary care provider.  If iron labs are worse in the future, she will call her office to reestablish care.   I discussed the assessment and treatment plan with the patient. The patient was provided an opportunity to ask questions and all were answered. The patient agreed with the plan and demonstrated an understanding of the instructions.  The patient was advised to call back or seek an in-person evaluation if the symptoms worsen or if the condition fails to improve as anticipated.    Rickard Patience, MD 03/12/2021 8:43 PM

## 2021-03-12 NOTE — Progress Notes (Signed)
No new concerns today 

## 2021-05-15 ENCOUNTER — Other Ambulatory Visit: Payer: Self-pay

## 2021-05-15 ENCOUNTER — Emergency Department
Admission: EM | Admit: 2021-05-15 | Discharge: 2021-05-15 | Disposition: A | Discharge status: Home / Self Care | Payer: BC Managed Care – PPO | Attending: Emergency Medicine | Admitting: Emergency Medicine

## 2021-05-15 ENCOUNTER — Encounter: Payer: Self-pay | Admitting: Emergency Medicine

## 2021-05-15 ENCOUNTER — Emergency Department: Payer: BC Managed Care – PPO

## 2021-05-15 DIAGNOSIS — H109 Unspecified conjunctivitis: Secondary | ICD-10-CM | POA: Insufficient documentation

## 2021-05-15 DIAGNOSIS — R630 Anorexia: Secondary | ICD-10-CM | POA: Insufficient documentation

## 2021-05-15 DIAGNOSIS — M791 Myalgia, unspecified site: Secondary | ICD-10-CM | POA: Insufficient documentation

## 2021-05-15 DIAGNOSIS — R059 Cough, unspecified: Secondary | ICD-10-CM | POA: Diagnosis present

## 2021-05-15 DIAGNOSIS — R5381 Other malaise: Secondary | ICD-10-CM | POA: Insufficient documentation

## 2021-05-15 DIAGNOSIS — J069 Acute upper respiratory infection, unspecified: Secondary | ICD-10-CM | POA: Diagnosis not present

## 2021-05-15 DIAGNOSIS — H1033 Unspecified acute conjunctivitis, bilateral: Secondary | ICD-10-CM

## 2021-05-15 MED ORDER — PREDNISONE 20 MG PO TABS: 40.0000 mg | ORAL_TABLET | Freq: Every day | ORAL | 0 refills | Status: AC

## 2021-05-15 MED ORDER — AZITHROMYCIN 250 MG PO TABS: ORAL_TABLET | ORAL | 0 refills | Status: DC

## 2021-05-15 MED ORDER — MOXIFLOXACIN HCL 0.5 % OP SOLN: 1.0000 [drp] | Freq: Three times a day (TID) | OPHTHALMIC | 0 refills | Status: AC

## 2021-05-15 MED ORDER — BENZONATATE 100 MG PO CAPS: ORAL_CAPSULE | ORAL | 0 refills | Status: DC

## 2021-05-15 MED ORDER — FLUTICASONE PROPIONATE 50 MCG/ACT NA SUSP: 2.0000 | Freq: Every day | NASAL | 0 refills | Status: DC

## 2021-05-15 NOTE — Discharge Instructions (Addendum)
Use the eye drops as prescribed Take the prescription antibiotic and steroid as prescribed. Take OTC Delsym for additional cough relief. Follow-up wit Dr. Carlynn Purl as needed.

## 2021-05-15 NOTE — ED Triage Notes (Signed)
Pt to ED from home c/o cough, congestion and sore throat for a couple days.  States productive cough thick yellow sputum.  States noticed drainage from left eye today with discomfort.  Pt A&Ox4, chest rise even and unlabored, left sclera red.  Also states lower back pain, denies urinary changes.  States took two home COVID tests that were negative.

## 2021-05-15 NOTE — ED Provider Notes (Signed)
[facility] Emergency Department Provider Note ____________________________________________  Time seen: 2232  I have reviewed the triage vital signs and the nursing notes.  HISTORY  Chief Complaint  URI and Conjunctivitis   HPI Carla Cox is a 35 y.o. female presents to the ED for evaluation of several days of cough cold symptoms.  Patient reports a negative home COVID test.  She notes that it started initially in her children who are school-age.  She presents with cough that is productive, congestion, and a sore throat.  He also noticed some drainage that initially in her left eye, now involving the right eye.  She denies any frank fevers but does note decreased appetite and generalized body aches and malaise.  She denies any chest pain, shortness of breath, diarrhea.   Past Medical History:  Diagnosis Date   Anemia    Anxiety     Patient Active Problem List   Diagnosis Date Noted   CIN II (cervical intraepithelial neoplasia II) 06/29/2018   Low grade squamous intraepithelial lesion (LGSIL) on cervical Pap smear 05/25/2018   Iron deficiency anemia 03/31/2018   Obesity (BMI 30.0-34.9) 03/25/2018   Eczema 03/25/2018    Past Surgical History:  Procedure Laterality Date   CESAREAN SECTION  2008   c-section   TUBAL LIGATION  12/2008    Prior to Admission medications   Medication Sig Start Date End Date Taking? Authorizing Provider  azithromycin (ZITHROMAX Z-PAK) 250 MG tablet Take 2 tablets (500 mg) on  Day 1,  followed by 1 tablet (250 mg) once daily on Days 2 through 5. 05/15/21  Yes Punam Broussard, Charlesetta Ivory, PA-C  benzonatate (TESSALON PERLES) 100 MG capsule Take 1-2 tabs TID prn cough 05/15/21  Yes Dalila Arca, Charlesetta Ivory, PA-C  fluticasone (FLONASE) 50 MCG/ACT nasal spray Place 2 sprays into both nostrils daily. 05/15/21  Yes Malee Grays, Charlesetta Ivory, PA-C  moxifloxacin (VIGAMOX) 0.5 % ophthalmic solution Place 1 drop into both eyes 3 (three)  times daily for 7 days. 05/15/21 05/22/21 Yes Mendi Constable, Charlesetta Ivory, PA-C  predniSONE (DELTASONE) 20 MG tablet Take 2 tablets (40 mg total) by mouth daily with breakfast for 5 days. 05/15/21 05/20/21 Yes Jalon Squier, Charlesetta Ivory, PA-C  atenolol (TENORMIN) 25 MG tablet Take 1 tablet (25 mg total) by mouth daily. 11/06/20   Alba Cory, MD  BIOTIN W/ VITAMINS C & E PO Take by mouth.    [provider]  escitalopram (LEXAPRO) 10 MG tablet Take 1 tablet (10 mg total) by mouth daily. Start 1 pill daily and after one week try going up to 2 pills 11/06/20   Alba Cory, MD  ferrous sulfate 325 (65 FE) MG tablet Take 325 mg by mouth daily with breakfast.    [provider]  hydrOXYzine (ATARAX/VISTARIL) 10 MG tablet Take 1 tablet (10 mg total) by mouth 3 (three) times daily as needed. 07/17/20   Alba Cory, MD  norgestimate-ethinyl estradiol (SPRINTEC 28) 0.25-35 MG-MCG tablet Take 1 tablet by mouth daily. 04/13/20   Nadara Mustard, MD    Allergies Patient has no known allergies.  Family History  Problem Relation Age of Onset   Hypertension Mother    Diabetes Father    Hypertension Other    Diabetes Other    Diabetes Maternal Grandmother    Hypertension Paternal Grandmother    Diabetes Paternal Grandmother    Heart attack Paternal Grandfather    Depression Son    Anxiety disorder Son  Seizures Son    Breast cancer Maternal Aunt    Breast cancer Paternal Aunt     Social History Social History   Tobacco Use   Smoking status: Never   Smokeless tobacco: Never  Vaping Use   Vaping Use: Never used  Substance Use Topics   Alcohol use: Yes    Comment: occasionally   Drug use: No    Review of Systems  Constitutional: Negative for fever. Eyes: Negative for visual changes.  Bilateral eye drainage ENT: Negative for sore throat. Cardiovascular: Negative for chest pain. Respiratory: Negative for shortness of breath.  Reports productive  cough Gastrointestinal: Negative for abdominal pain, vomiting and diarrhea. Genitourinary: Negative for dysuria. Musculoskeletal: Negative for back pain. Skin: Negative for rash. Neurological: Negative for headaches, focal weakness or numbness. ____________________________________________  PHYSICAL EXAM:  VITAL SIGNS: ED Triage Vitals  Enc Vitals Group     BP 05/15/21 1936 (!) 161/91     Pulse Rate 05/15/21 1936 (!) 110     Resp 05/15/21 1936 16     Temp 05/15/21 1936 98.8 F (37.1 C)     Temp Source 05/15/21 1936 Oral     SpO2 05/15/21 1936 99 %     Weight 05/15/21 1937 200 lb (90.7 kg)     Height 05/15/21 1937 5\' 4"  (1.626 m)     Head Circumference --      Peak Flow --      Pain Score 05/15/21 1936 5     Pain Loc --      Pain Edu? --      Excl. in GC? --     Constitutional: Alert and oriented. Well appearing and in no distress. Head: Normocephalic and atraumatic. Eyes: Conjunctivae are injected bilaterally. PERRL.  Purulent drainage is noted bilaterally.  Normal extraocular movements Ears: Canals clear. TMs intact bilaterally. Nose: No congestion/rhinorrhea/epistaxis. Mouth/Throat: Mucous membranes are moist. Neck: Supple. No thyromegaly. Hematological/Lymphatic/Immunological: No cervical lymphadenopathy. Cardiovascular: Normal rate, regular rhythm. Normal distal pulses. Respiratory: Normal respiratory effort. No wheezes/rales/rhonchi. Gastrointestinal: Soft and nontender. No distention. Musculoskeletal: Nontender with normal range of motion in all extremities.  Neurologic:  Normal gait without ataxia. Normal speech and language. No gross focal neurologic deficits are appreciated. Skin:  Skin is warm, dry and intact. No rash noted. Psychiatric: Mood and affect are normal. Patient exhibits appropriate insight and judgment. ____________________________________________    {LABS (pertinent  positives/negatives)  ____________________________________________  {EKG  ____________________________________________   RADIOLOGY Official radiology report(s): DG Chest 2 View  Result Date: 05/15/2021 CLINICAL DATA:  Productive cough EXAM: CHEST - 2 VIEW COMPARISON:  Radiograph 08/19/2011, chest CT 08/06/2011 FINDINGS: Unchanged cardiomediastinal silhouette. There is no focal airspace consolidation. There is no large pleural effusion or visible pneumothorax. There is no acute osseous abnormality. IMPRESSION: No evidence of acute cardiopulmonary disease. Electronically Signed   By: 10/04/2011 M.D.   On: 05/15/2021 20:12   ____________________________________________  PROCEDURES   Procedures ____________________________________________   INITIAL IMPRESSION / ASSESSMENT AND PLAN / ED COURSE  As part of my medical decision making, I reviewed the following data within the electronic MEDICAL RECORD NUMBER Labs reviewed as noted, Radiograph reviewed WNL, and Notes from prior ED visits   DDX: CAP, bronchitis, viral URI, AOM, conjunctivitis  Patient ED evaluation of persistent cough symptoms last week.  Patient reports sinus congestion, sore throat, and productive cough.  She reports negative home COVID test but presented to the ED for evaluation of her symptoms.  Test x-rays negative for any acute  infectious process.  Patient was treated empirically with steroid course, azithromycin, and cough medicines.  He is also given a prescription for Vigamox for bacterial conjunctivitis patient follow with primary provider for ongoing symptoms.  Her vitals been reviewed.  AHNYLA MENDEL was evaluated in Emergency Department on 05/15/2021 for the symptoms described in the history of present illness. She was evaluated in the context of the global COVID-19 pandemic, which necessitated consideration that the patient might be at risk for infection with the SARS-CoV-2 virus that causes COVID-19. Institutional  protocols and algorithms that pertain to the evaluation of patients at risk for COVID-19 are in a state of rapid change based on information released by regulatory bodies including the CDC and federal and state organizations. These policies and algorithms were followed during the patient's care in the ED.  ____________________________________________  FINAL CLINICAL IMPRESSION(S) / ED DIAGNOSES  Final diagnoses:  Viral URI with cough  Acute bacterial conjunctivitis of both eyes      Raziel Koenigs, Charlesetta Ivory, PA-C 05/15/21 2348    Shaune Pollack, MD 05/17/21 0230

## 2021-05-21 ENCOUNTER — Other Ambulatory Visit: Payer: Self-pay | Admitting: Family Medicine

## 2021-05-21 DIAGNOSIS — Z803 Family history of malignant neoplasm of breast: Secondary | ICD-10-CM

## 2021-05-28 ENCOUNTER — Ambulatory Visit (INDEPENDENT_AMBULATORY_CARE_PROVIDER_SITE_OTHER): Payer: BC Managed Care – PPO | Admitting: Family Medicine

## 2021-05-28 ENCOUNTER — Encounter: Payer: Self-pay | Admitting: Family Medicine

## 2021-05-28 ENCOUNTER — Other Ambulatory Visit: Payer: Self-pay

## 2021-05-28 VITALS — BP 134/82 | HR 92 | Temp 98.3°F | Resp 16 | Ht 64.0 in | Wt 200.0 lb

## 2021-05-28 DIAGNOSIS — Z23 Encounter for immunization: Secondary | ICD-10-CM | POA: Diagnosis not present

## 2021-05-28 DIAGNOSIS — Z0000 Encounter for general adult medical examination without abnormal findings: Secondary | ICD-10-CM

## 2021-05-28 NOTE — Patient Instructions (Signed)
Preventive Care 21-37 Years Old, Female Preventive care refers to lifestyle choices and visits with your health care provider that can promote health and wellness. This includes: A yearly physical exam. This is also called an annual wellness visit. Regular dental and eye exams. Immunizations. Screening for certain conditions. Healthy lifestyle choices, such as: Eating a healthy diet. Getting regular exercise. Not using drugs or products that contain nicotine and tobacco. Limiting alcohol use. What can I expect for my preventive care visit? Physical exam Your health care provider may check your: Height and weight. These may be used to calculate your BMI (body mass index). BMI is a measurement that tells if you are at a healthy weight. Heart rate and blood pressure. Body temperature. Skin for abnormal spots. Counseling Your health care provider may ask you questions about your: Past medical problems. Family's medical history. Alcohol, tobacco, and drug use. Emotional well-being. Home life and relationship well-being. Sexual activity. Diet, exercise, and sleep habits. Work and work environment. Access to firearms. Method of birth control. Menstrual cycle. Pregnancy history. What immunizations do I need? Vaccines are usually given at various ages, according to a schedule. Your health care provider will recommend vaccines for you based on your age, medical history, and lifestyle or other factors, such as travel or where you work. What tests do I need? Blood tests Lipid and cholesterol levels. These may be checked every 5 years starting at age 20. Hepatitis C test. Hepatitis B test. Screening Diabetes screening. This is done by checking your blood sugar (glucose) after you have not eaten for a while (fasting). STD (sexually transmitted disease) testing, if you are at risk. BRCA-related cancer screening. This may be done if you have a family history of breast, ovarian, tubal, or  peritoneal cancers. Pelvic exam and Pap test. This may be done every 3 years starting at age 21. Starting at age 30, this may be done every 5 years if you have a Pap test in combination with an HPV test. Talk with your health care provider about your test results, treatment options, and if necessary, the need for more tests. Follow these instructions at home: Eating and drinking  Eat a healthy diet that includes fresh fruits and vegetables, whole grains, lean protein, and low-fat dairy products. Take vitamin and mineral supplements as recommended by your health care provider. Do not drink alcohol if: Your health care provider tells you not to drink. You are pregnant, may be pregnant, or are planning to become pregnant. If you drink alcohol: Limit how much you have to 0-1 drink a day. Be aware of how much alcohol is in your drink. In the [place] one drink equals one 12 oz bottle of beer (355 mL), one 5 oz glass of wine (148 mL), or one 1 oz glass of hard liquor (44 mL). Lifestyle Take daily care of your teeth and gums. Brush your teeth every morning and night with fluoride toothpaste. Floss one time each day. Stay active. Exercise for at least 30 minutes 5 or more days each week. Do not use any products that contain nicotine or tobacco, such as cigarettes, e-cigarettes, and chewing tobacco. If you need help quitting, ask your health care provider. Do not use drugs. If you are sexually active, practice safe sex. Use a condom or other form of protection to prevent STIs (sexually transmitted infections). If you do not wish to become pregnant, use a form of birth control. If you plan to become pregnant, see your health care provider   for a prepregnancy visit. Find healthy ways to cope with stress, such as: Meditation, yoga, or listening to music. Journaling. Talking to a trusted person. Spending time with friends and family. Safety Always wear your seat belt while driving or riding in a  vehicle. Do not drive: If you have been drinking alcohol. Do not ride with someone who has been drinking. When you are tired or distracted. While texting. Wear a helmet and other protective equipment during sports activities. If you have firearms in your house, make sure you follow all gun safety procedures. Seek help if you have been physically or sexually abused. What's next? Go to your health care provider once a year for an annual wellness visit. Ask your health care provider how often you should have your eyes and teeth checked. Stay up to date on all vaccines. This information is not intended to replace advice given to you by your health care provider. Make sure you discuss any questions you have with your health care provider. Document Revised: 09/29/2020 Document Reviewed: 04/02/2018 [employer] Patient Education  2022 [employer]  

## 2021-05-28 NOTE — Progress Notes (Signed)
Name: Carla Cox   MRN: [id number]    DOB: 10-Dec-1985   Date:05/28/2021       Progress Note  Subjective  Chief Complaint  Annual Exam  HPI  Patient presents for annual CPE.  Diet: cook at home, she drinks sodas daily, she will try to cut down  Exercise: discussed increasing physical activity    [place] Office Visit from 05/28/2021 in [facility]  AUDIT-C Score 2      Depression: Phq 9 is  positive she has also been grieving and stopped Lexapro, advised to resume medications   Depression screen [facility] 2/9 05/28/2021 11/06/2020 08/07/2020 07/17/2020 05/25/2020  Decreased Interest 2 0 1 1 1   Down, Depressed, Hopeless 1 0 0 3 0  PHQ - 2 Score 3 0 1 4 1   Altered sleeping 2 0 0 2 0  Tired, decreased energy 3 0 0 2 1  Change in appetite 0 1 0 1 0  Feeling bad or failure about yourself  0 0 0 3 0  Trouble concentrating 2 0 1 1 0  Moving slowly or fidgety/restless 0 0 0 2 0  Suicidal thoughts 0 0 0 0 0  PHQ-9 Score 10 1 2 15 2   Difficult doing work/chores - - - - Not difficult at all   Hypertension: BP Readings from Last 3 Encounters:  05/28/21 134/82  05/15/21 (!) 161/91  11/06/20 132/80   Obesity: Wt Readings from Last 3 Encounters:  05/28/21 200 lb (90.7 kg)  05/15/21 200 lb (90.7 kg)  11/06/20 201 lb (91.2 kg)   BMI Readings from Last 3 Encounters:  05/28/21 34.33 kg/m  05/15/21 34.33 kg/m  11/06/20 34.50 kg/m     Vaccines:   Pneumonia: educated and discussed with patient. Flu: today  COVID-19: discussed booster   Hep C Screening: 03/25/18 STD testing and prevention (HIV/chl/gon/syphilis): 03/25/18 Intimate partner violence: negative Sexual History : same partner , no pain or discomfort, no post-coital bleeding  Menstrual History/LMP/Abnormal Bleeding: tubes are tied , also takes ocp for heavy cycles Incontinence Symptoms:  no problems   Breast cancer:  - Last Mammogram: N/A - BRCA gene screening: maternal and paternal aunts  had breast cancer - early age.   Osteoporosis: Discussed high calcium and vitamin D supplementation, weight bearing exercises  Cervical cancer screening: 04/13/20  Skin cancer: Discussed monitoring for atypical lesions  Colorectal cancer: N/A   Lung cancer:  Low Dose CT Chest recommended if Age 57-80 years, 20 pack-year currently smoking OR have quit w/in 15years. Patient does not qualify.   ECG: 08/20/11  Advanced Care Planning: A voluntary discussion about advance care planning including the explanation and discussion of advance directives.  Discussed health care proxy and Living will, and the patient was able to identify a health care proxy as Scot Dock - her partner, not the father of her two children   Lipids: Lab Results  Component Value Date   CHOL 105 05/25/2019   CHOL 108 03/25/2018   Lab Results  Component Value Date   HDL 57 05/25/2019   HDL 60 03/25/2018   Lab Results  Component Value Date   LDLCALC 35 05/25/2019   LDLCALC 34 03/25/2018   Lab Results  Component Value Date   TRIG 57 05/25/2019   TRIG 62 03/25/2018   Lab Results  Component Value Date   CHOLHDL 1.8 05/25/2019   CHOLHDL 1.8 03/25/2018   No results found for: LDLDIRECT  Glucose: Glucose, Bld  Date Value Ref Range  Status  07/17/2020 92 65 - 99 mg/dL Final    Comment:    .            Fasting reference interval .   05/25/2020 87 65 - 99 mg/dL Final    Comment:    .            Fasting reference interval .   05/25/2019 87 65 - 99 mg/dL Final    Comment:    .            Fasting reference interval .     Patient Active Problem List   Diagnosis Date Noted   CIN II (cervical intraepithelial neoplasia II) 06/29/2018   Low grade squamous intraepithelial lesion (LGSIL) on cervical Pap smear 05/25/2018   Iron deficiency anemia 03/31/2018   Obesity (BMI 30.0-34.9) 03/25/2018   Eczema 03/25/2018    Past Surgical History:  Procedure Laterality Date   CESAREAN SECTION  2008   c-section    TUBAL LIGATION  12/2008    Family History  Problem Relation Age of Onset   Hypertension Mother    Diabetes Father    Hypertension Other    Diabetes Other    Diabetes Maternal Grandmother    Hypertension Paternal Grandmother    Diabetes Paternal Grandmother    Heart attack Paternal Grandfather    Depression Son    Anxiety disorder Son    Seizures Son    Breast cancer Maternal Aunt    Breast cancer Paternal Aunt     Social History   Socioeconomic History   Marital status: Divorced    Spouse name: Not on file   Number of children: 2   Years of education: Not on file   Highest education level: Bachelor's degree (e.g., BA, AB, BS)  Occupational History   Occupation: accountant   Tobacco Use   Smoking status: Never   Smokeless tobacco: Never  Vaping Use   Vaping Use: Never used  Substance and Sexual Activity   Alcohol use: Yes    Comment: occasionally   Drug use: No   Sexual activity: Yes    Partners: Male    Birth control/protection: Other-see comments    Comment: Tubal Ligation  Other Topics Concern   Not on file  Social History Narrative   Divorced since age 29 ,now her sons live with her.       She lives with fiance /Shon since 2016   Social Determinants of Health   Financial Resource Strain: Low Risk    Difficulty of Paying Living Expenses: Not hard at all  Food Insecurity: No Food Insecurity   Worried About Charity fundraiser in the Last Year: Never true   Arboriculturist in the Last Year: Never true  Transportation Needs: No Transportation Needs   Lack of Transportation (Medical): No   Lack of Transportation (Non-Medical): No  Physical Activity: Insufficiently Active   Days of Exercise per Week: 1 day   Minutes of Exercise per Session: 30 min  Stress: Stress Concern Present   Feeling of Stress : To some extent  Social Connections: Moderately Isolated   Frequency of Communication with Friends and Family: More than three times a week   Frequency of  Social Gatherings with Friends and Family: Once a week   Attends Religious Services: Never   Marine scientist or Organizations: No   Attends Archivist Meetings: Never   Marital Status: Living with partner  Intimate Partner Violence: Not  At Risk   Fear of Current or Ex-Partner: No   Emotionally Abused: No   Physically Abused: No   Sexually Abused: No     Current Outpatient Medications:    atenolol (TENORMIN) 25 MG tablet, Take 1 tablet (25 mg total) by mouth daily., Disp: 90 tablet, Rfl: 1   escitalopram (LEXAPRO) 10 MG tablet, Take 1 tablet (10 mg total) by mouth daily. Start 1 pill daily and after one week try going up to 2 pills, Disp: 90 tablet, Rfl: 1   azithromycin (ZITHROMAX Z-PAK) 250 MG tablet, Take 2 tablets (500 mg) on  Day 1,  followed by 1 tablet (250 mg) once daily on Days 2 through 5. (Patient not taking: Reported on 05/28/2021), Disp: 6 each, Rfl: 0   benzonatate (TESSALON PERLES) 100 MG capsule, Take 1-2 tabs TID prn cough (Patient not taking: Reported on 05/28/2021), Disp: 30 capsule, Rfl: 0   BIOTIN W/ VITAMINS C & E PO, Take by mouth. (Patient not taking: Reported on 05/28/2021), Disp: , Rfl:    ferrous sulfate 325 (65 FE) MG tablet, Take 325 mg by mouth daily with breakfast. (Patient not taking: Reported on 05/28/2021), Disp: , Rfl:    fluticasone (FLONASE) 50 MCG/ACT nasal spray, Place 2 sprays into both nostrils daily. (Patient not taking: Reported on 05/28/2021), Disp: 16 g, Rfl: 0   hydrOXYzine (ATARAX/VISTARIL) 10 MG tablet, Take 1 tablet (10 mg total) by mouth 3 (three) times daily as needed. (Patient not taking: Reported on 05/28/2021), Disp: 30 tablet, Rfl: 0   norgestimate-ethinyl estradiol (SPRINTEC 28) 0.25-35 MG-MCG tablet, Take 1 tablet by mouth daily. (Patient not taking: Reported on 05/28/2021), Disp: 84 tablet, Rfl: 3  No Known Allergies   ROS  Constitutional: Negative for fever or weight change.  Respiratory: Negative for cough and  shortness of breath.   Cardiovascular: Negative for chest pain or palpitations.  Gastrointestinal: Negative for abdominal pain, no bowel changes.  Musculoskeletal: Negative for gait problem or joint swelling.  Skin: Negative for rash.  Neurological: Negative for dizziness or headache.  No other specific complaints in a complete review of systems (except as listed in HPI above).   Objective  Vitals:   05/28/21 0822  BP: 134/82  Pulse: 92  Resp: 16  Temp: 98.3 F (36.8 C)  SpO2: 98%  Weight: 200 lb (90.7 kg)  Height: 5' 4"  (1.626 m)    Body mass index is 34.33 kg/m.  Physical Exam  Constitutional: Patient appears well-developed and well-nourished. No distress.  HENT: Head: Normocephalic and atraumatic. Ears: B TMs ok, no erythema or effusion; Nose: Not done Mouth/Throat: not done  Eyes: Conjunctivae and EOM are normal. Pupils are equal, round, and reactive to light. No scleral icterus.  Neck: Normal range of motion. Neck supple. No JVD present. No thyromegaly present.  Cardiovascular: Normal rate, regular rhythm and normal heart sounds.  No murmur heard. No BLE edema. Pulmonary/Chest: Effort normal and breath sounds normal. No respiratory distress. Abdominal: Soft. Bowel sounds are normal, no distension. There is no tenderness. no masses Breast: no lumps or masses, no nipple discharge or rashes FEMALE GENITALIA:  Not done  RECTAL:not done  Musculoskeletal: Normal range of motion, no joint effusions. No gross deformities Neurological: he is alert and oriented to person, place, and time. No cranial nerve deficit. Coordination, balance, strength, speech and gait are normal.  Skin: Skin is warm and dry. No rash noted. No erythema.  Psychiatric: Patient has a normal mood and affect. behavior is normal.  Judgment and thought content normal.    Recent Results (from the past 2160 hour(s))  Iron and TIBC     Status: None   Collection Time: 03/09/21  2:17 PM  Result Value Ref Range    Iron 104 28 - 170 ug/dL   TIBC 367 250 - 450 ug/dL   Saturation Ratios 28 10.4 - 31.8 %   UIBC 263 ug/dL    Comment: Performed at [facility], [address] [place], [place] [address]  Ferritin     Status: None   Collection Time: 03/09/21  2:17 PM  Result Value Ref Range   Ferritin 42 11 - 307 ng/mL    Comment: Performed at [facility], [address] [place], [place] [address]  CBC with Differential/Platelet     Status: None   Collection Time: 03/09/21  2:17 PM  Result Value Ref Range   WBC 8.7 4.0 - 10.5 K/uL   RBC 4.29 3.87 - 5.11 MIL/uL   Hemoglobin 12.7 12.0 - 15.0 g/dL   HCT 37.9 36.0 - 46.0 %   MCV 88.3 80.0 - 100.0 fL   MCH 29.6 26.0 - 34.0 pg   MCHC 33.5 30.0 - 36.0 g/dL   RDW 13.4 11.5 - 15.5 %   Platelets 348 150 - 400 K/uL   nRBC 0.0 0.0 - 0.2 %   Neutrophils Relative % 68 %   Neutro Abs 5.9 1.7 - 7.7 K/uL   Lymphocytes Relative 24 %   Lymphs Abs 2.1 0.7 - 4.0 K/uL   Monocytes Relative 5 %   Monocytes Absolute 0.5 0.1 - 1.0 K/uL   Eosinophils Relative 2 %   Eosinophils Absolute 0.2 0.0 - 0.5 K/uL   Basophils Relative 1 %   Basophils Absolute 0.0 0.0 - 0.1 K/uL   Immature Granulocytes 0 %   Abs Immature Granulocytes 0.02 0.00 - 0.07 K/uL    Comment: Performed at [facility], [address] [place], [place] [address]      Fall Risk: Fall Risk  05/28/2021 11/06/2020 08/07/2020 07/17/2020 05/25/2020  Falls in the past year? 0 0 0 0 0  Number falls in past yr: 0 0 0 0 0  Injury with Fall? 0 0 0 0 0  Risk for fall due to : No Fall Risks - - - -  Follow up Falls prevention discussed - - - -     Functional Status Survey: Is the patient deaf or have difficulty hearing?: No Does the patient have difficulty seeing, even when wearing glasses/contacts?: No Does the patient have difficulty concentrating, remembering, or making decisions?: No Does the patient have difficulty walking or climbing stairs?: No Does the patient have  difficulty dressing or bathing?: No Does the patient have difficulty doing errands alone such as visiting a doctor's office or shopping?: No   Assessment & Plan  1. Well adult exam   2. Need for immunization against influenza  - Flu Vaccine QUAD 6+ mos PF IM (Fluarix Quad PF)   -USPSTF grade A and B recommendations reviewed with patient; age-appropriate recommendations, preventive care, screening tests, etc discussed and encouraged; healthy living encouraged; see AVS for patient education given to patient -Discussed importance of 150 minutes of physical activity weekly, eat two servings of fish weekly, eat one serving of tree nuts ( cashews, pistachios, pecans, almonds.Marland Kitchen) every other day, eat 6 servings of fruit/vegetables daily and drink plenty of water and avoid sweet beverages.

## 2021-08-28 NOTE — Progress Notes (Deleted)
Name: Carla Cox   MRN: [id number]    DOB: [birth year]   Date:08/28/2021       Progress Note  Subjective  Chief Complaint  Follow Up  HPI  Depression/Anxiety: she was treated for similar symptoms  in her mid 20's when she went through a divorce. She has two children from her previous marriage a 38 yo and 54 yo son. They used to live with their father in [place] but in May 2020 - in the middle of the pandemic - they move in with her because their father was going through a divorce at the time. Her oldest son has been anxious for a while, however in Nov her youngest son (21 yo) had a seizure - while in the back seat of father's car . Oldest son , Fayrene Fearing, was more anxious since the seizure episode, he is doing well as long as he can be around his brother . Kids are back to school, playing sports ( running track) and no recent seizure episodes. She is feeling less stressed since no recent seizure episodes.  Tachycardia/: doing better on beta-blocker, no side effects. Likely triggered by increase in anxiety. She states since started on Atenolol bp has normalized, states at home around 120's/80's . She denies any palpitation   Patient Active Problem List   Diagnosis Date Noted   CIN II (cervical intraepithelial neoplasia II) 06/29/2018   Low grade squamous intraepithelial lesion (LGSIL) on cervical Pap smear 05/25/2018   Iron deficiency anemia 03/31/2018   Obesity (BMI 30.0-34.9) 03/25/2018   Eczema 03/25/2018    Past Surgical History:  Procedure Laterality Date   CESAREAN SECTION  2008   c-section   TUBAL LIGATION  12/2008    Family History  Problem Relation Age of Onset   Hypertension Mother    Diabetes Father    Hypertension Other    Diabetes Other    Diabetes Maternal Grandmother    Hypertension Paternal Grandmother    Diabetes Paternal Grandmother    Heart attack Paternal Grandfather    Depression Son    Anxiety disorder Son    Seizures Son    Breast cancer Maternal  Aunt    Breast cancer Paternal Aunt     Social History   Tobacco Use   Smoking status: Never   Smokeless tobacco: Never  Substance Use Topics   Alcohol use: Yes    Comment: occasionally     Current Outpatient Medications:    atenolol (TENORMIN) 25 MG tablet, Take 1 tablet (25 mg total) by mouth daily., Disp: 90 tablet, Rfl: 1   escitalopram (LEXAPRO) 10 MG tablet, Take 1 tablet (10 mg total) by mouth daily. Start 1 pill daily and after one week try going up to 2 pills, Disp: 90 tablet, Rfl: 1   hydrOXYzine (ATARAX/VISTARIL) 10 MG tablet, Take 1 tablet (10 mg total) by mouth 3 (three) times daily as needed. (Patient not taking: Reported on 05/28/2021), Disp: 30 tablet, Rfl: 0   norgestimate-ethinyl estradiol (SPRINTEC 28) 0.25-35 MG-MCG tablet, Take 1 tablet by mouth daily. (Patient not taking: Reported on 05/28/2021), Disp: 84 tablet, Rfl: 3  No Known Allergies  I personally reviewed active problem list, medication list, allergies, family history, social history, health maintenance with the patient/caregiver today.   ROS  ***  Objective  There were no vitals filed for this visit.  There is no height or weight on file to calculate BMI.  Physical Exam ***  No results found for this or any previous  visit (from the past 2160 hour(s)).   PHQ2/9: Depression screen [facility] 2/9 05/28/2021 11/06/2020 08/07/2020 07/17/2020 05/25/2020  Decreased Interest 2 0 1 1 1   Down, Depressed, Hopeless 1 0 0 3 0  PHQ - 2 Score 3 0 1 4 1   Altered sleeping 2 0 0 2 0  Tired, decreased energy 3 0 0 2 1  Change in appetite 0 1 0 1 0  Feeling bad or failure about yourself  0 0 0 3 0  Trouble concentrating 2 0 1 1 0  Moving slowly or fidgety/restless 0 0 0 2 0  Suicidal thoughts 0 0 0 0 0  PHQ-9 Score 10 1 2 15 2   Difficult doing work/chores - - - - Not difficult at all    phq 9 is {gen pos   Fall Risk: Fall Risk  05/28/2021 11/06/2020 08/07/2020 07/17/2020 05/25/2020  Falls in the  past year? 0 0 0 0 0  Number falls in past yr: 0 0 0 0 0  Injury with Fall? 0 0 0 0 0  Risk for fall due to : No Fall Risks - - - -  Follow up Falls prevention discussed - - - -      Functional Status Survey:      Assessment & Plan  *** There are no diagnoses linked to this encounter.

## 2021-08-29 ENCOUNTER — Encounter: Payer: Self-pay | Admitting: Family Medicine

## 2021-08-29 ENCOUNTER — Ambulatory Visit: Payer: BC Managed Care – PPO | Admitting: Family Medicine

## 2024-07-15 NOTE — Patient Instructions (Signed)
 Preventive Care 40-40 Years Old, Female  Preventive care refers to lifestyle choices and visits with your health care provider that can promote health and wellness. Preventive care visits are also called wellness exams.  What can I expect for my preventive care visit?  Counseling  Your health care provider may ask you questions about your:  Medical history, including:  Past medical problems.  Family medical history.  Pregnancy history.  Current health, including:  Menstrual cycle.  Method of birth control.  Emotional well-being.  Home life and relationship well-being.  Sexual activity and sexual health.  Lifestyle, including:  Alcohol, nicotine or tobacco, and drug use.  Access to firearms.  Diet, exercise, and sleep habits.  Work and work Astronomer.  Sunscreen use.  Safety issues such as seatbelt and bike helmet use.  Physical exam  Your health care provider will check your:  Height and weight. These may be used to calculate your BMI (body mass index). BMI is a measurement that tells if you are at a healthy weight.  Waist circumference. This measures the distance around your waistline. This measurement also tells if you are at a healthy weight and may help predict your risk of certain diseases, such as type 2 diabetes and high blood pressure.  Heart rate and blood pressure.  Body temperature.  Skin for abnormal spots.  What immunizations do I need?    Vaccines are usually given at various ages, according to a schedule. Your health care provider will recommend vaccines for you based on your age, medical history, and lifestyle or other factors, such as travel or where you work.  What tests do I need?  Screening  Your health care provider may recommend screening tests for certain conditions. This may include:  Lipid and cholesterol levels.  Diabetes screening. This is done by checking your blood sugar (glucose) after you have not eaten for a while (fasting).  Pelvic exam and Pap test.  Hepatitis B test.  Hepatitis C  test.  HIV (human immunodeficiency virus) test.  STI (sexually transmitted infection) testing, if you are at risk.  Lung cancer screening.  Colorectal cancer screening.  Mammogram. Talk with your health care provider about when you should start having regular mammograms. This may depend on whether you have a family history of breast cancer.  BRCA-related cancer screening. This may be done if you have a family history of breast, ovarian, tubal, or peritoneal cancers.  Bone density scan. This is done to screen for osteoporosis.  Talk with your health care provider about your test results, treatment options, and if necessary, the need for more tests.  Follow these instructions at home:  Eating and drinking    Eat a diet that includes fresh fruits and vegetables, whole grains, lean protein, and low-fat dairy products.  Take vitamin and mineral supplements as recommended by your health care provider.  Do not drink alcohol if:  Your health care provider tells you not to drink.  You are pregnant, may be pregnant, or are planning to become pregnant.  If you drink alcohol:  Limit how much you have to 0-1 drink a day.  Know how much alcohol is in your drink. In the [place] one drink equals one 12 oz bottle of beer (355 mL), one 5 oz glass of wine (148 mL), or one 1 oz glass of hard liquor (44 mL).  Lifestyle  Brush your teeth every morning and night with fluoride toothpaste. Floss one time each day.  Exercise for at least  30 minutes 5 or more days each week.  Do not use any products that contain nicotine or tobacco. These products include cigarettes, chewing tobacco, and vaping devices, such as e-cigarettes. If you need help quitting, ask your health care provider.  Do not use drugs.  If you are sexually active, practice safe sex. Use a condom or other form of protection to prevent STIs.  If you do not wish to become pregnant, use a form of birth control. If you plan to become pregnant, see your health care provider for a  prepregnancy visit.  Take aspirin only as told by your health care provider. Make sure that you understand how much to take and what form to take. Work with your health care provider to find out whether it is safe and beneficial for you to take aspirin daily.  Find healthy ways to manage stress, such as:  Meditation, yoga, or listening to music.  Journaling.  Talking to a trusted person.  Spending time with friends and family.  Minimize exposure to UV radiation to reduce your risk of skin cancer.  Safety  Always wear your seat belt while driving or riding in a vehicle.  Do not drive:  If you have been drinking alcohol. Do not ride with someone who has been drinking.  When you are tired or distracted.  While texting.  If you have been using any mind-altering substances or drugs.  Wear a helmet and other protective equipment during sports activities.  If you have firearms in your house, make sure you follow all gun safety procedures.  Seek help if you have been physically or sexually abused.  What's next?  Visit your health care provider once a year for an annual wellness visit.  Ask your health care provider how often you should have your eyes and teeth checked.  Stay up to date on all vaccines.  This information is not intended to replace advice given to you by your health care provider. Make sure you discuss any questions you have with your health care provider.  Document Revised: 01/17/2021 Document Reviewed: 01/17/2021  Elsevier Patient Education  2024 [employer].

## 2024-07-16 ENCOUNTER — Ambulatory Visit (INDEPENDENT_AMBULATORY_CARE_PROVIDER_SITE_OTHER): Admitting: Family Medicine

## 2024-07-16 ENCOUNTER — Encounter: Payer: Self-pay | Admitting: Family Medicine

## 2024-07-16 VITALS — BP 126/74 | HR 94 | Resp 16 | Ht 63.31 in | Wt 212.2 lb

## 2024-07-16 DIAGNOSIS — Z01419 Encounter for gynecological examination (general) (routine) without abnormal findings: Secondary | ICD-10-CM

## 2024-07-16 DIAGNOSIS — Z131 Encounter for screening for diabetes mellitus: Secondary | ICD-10-CM | POA: Diagnosis not present

## 2024-07-16 DIAGNOSIS — Z862 Personal history of diseases of the blood and blood-forming organs and certain disorders involving the immune mechanism: Secondary | ICD-10-CM

## 2024-07-16 DIAGNOSIS — Z13220 Encounter for screening for lipoid disorders: Secondary | ICD-10-CM | POA: Diagnosis not present

## 2024-07-16 DIAGNOSIS — Z1231 Encounter for screening mammogram for malignant neoplasm of breast: Secondary | ICD-10-CM

## 2024-07-16 NOTE — Progress Notes (Signed)
 Name: Carla Cox   MRN: [id number]    DOB: 14-Oct-1985   Date:07/16/2024       Progress Note  Subjective  Chief Complaint  Chief Complaint  Patient presents with   Annual Exam    HPI  Patient presents for annual CPE.  Diet: she has been meal prepping  Exercise: continue regular activity   Last Eye Exam: completed Last Dental Exam: completed  Flowsheet Row Office Visit from 07/16/2024 in [facility]  AUDIT-C Score 1    Depression: Phq 9 is  negative    07/16/2024    8:53 AM 05/28/2021    8:20 AM 11/06/2020    9:43 AM 08/07/2020    1:55 PM 07/17/2020    7:47 AM  Depression screen PHQ 2/9  Decreased Interest 0 2 0 1 1  Down, Depressed, Hopeless 0 1 0 0 3  PHQ - 2 Score 0 3 0 1 4  Altered sleeping  2 0 0 2  Tired, decreased energy  3 0 0 2  Change in appetite  0 1 0 1  Feeling bad or failure about yourself   0 0 0 3  Trouble concentrating  2 0 1 1  Moving slowly or fidgety/restless  0 0 0 2  Suicidal thoughts  0 0 0 0  PHQ-9 Score  10  1  2  15       Data saved with a previous flowsheet row definition   Hypertension: BP Readings from Last 3 Encounters:  07/16/24 126/74  05/28/21 134/82  05/15/21 (!) 161/91   Obesity: Wt Readings from Last 3 Encounters:  07/16/24 212 lb 3.2 oz (96.3 kg)  05/28/21 200 lb (90.7 kg)  05/15/21 200 lb (90.7 kg)   BMI Readings from Last 3 Encounters:  07/16/24 37.22 kg/m  05/28/21 34.33 kg/m  05/15/21 34.33 kg/m     Vaccines: reviewed with the patient.   Hep C Screening: completed STD testing and prevention (HIV/chl/gon/syphilis): Not interested  Intimate partner violence: negative screen  Sexual History : no problems  Menstrual History/LMP/Abnormal Bleeding: menorrhagia with regular cycles  Discussed importance of follow up if any post-menopausal bleeding: not applicable  Incontinence Symptoms: negative for symptoms   Breast cancer:  - Last Mammogram: due for first mammogram  - BRCA gene  screening: four aunts with breast cancer two in each side of the family. One of the aunts had genetic testing   Osteoporosis Prevention : Discussed high calcium and vitamin D supplementation, weight bearing exercises Bone density :not applicable   Cervical cancer screening: patient declined Currently on her cycle  Skin cancer: Discussed monitoring for atypical lesions  Colorectal cancer: starts at age 25    Lung cancer:  Low Dose CT Chest recommended if Age 46-80 years, 20 pack-year currently smoking OR have quit w/in 15years. Patient does not qualify for screen   ECG: 2013  Advanced Care Planning: A voluntary discussion about advance care planning including the explanation and discussion of advance directives.  Discussed health care proxy and Living will, and the patient was able to identify a health care proxy as mother .  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 05/25/2018   Iron  deficiency anemia 03/31/2018   Obesity (BMI 30.0-34.9) 03/25/2018   Eczema  03/25/2018    Past Surgical History:  Procedure Laterality Date   CESAREAN SECTION  2008   c-section   TUBAL LIGATION  12/2008    Family History  Problem Relation Age of Onset   Hypertension Mother    Diabetes Father    Hypertension Other    Diabetes Other    Asthma Other    Diabetes Maternal Grandmother    Hypertension Paternal Grandmother    Diabetes Paternal Grandmother    Heart attack Paternal Grandfather    Depression Son    Anxiety disorder Son    Seizures Son    Breast cancer Maternal Aunt    Breast cancer Paternal Aunt    Cancer Maternal Aunt    Cancer Paternal Aunt    Cancer Paternal Aunt     Social History   Socioeconomic History   Marital status: Divorced    Spouse name: Not on file    Number of children: 2   Years of education: Not on file   Highest education level: Bachelor's degree (e.g., BA, AB, BS)  Occupational History   Occupation: accountant   Tobacco Use   Smoking status: Never   Smokeless tobacco: Never  Vaping Use   Vaping status: Never Used  Substance and Sexual Activity   Alcohol use: Yes    Alcohol/week: 1.0 standard drink of alcohol    Types: 1 Glasses of wine per week    Comment: occasionally   Drug use: Never   Sexual activity: Yes    Partners: Male    Birth control/protection: Other-see comments    Comment: Tubal Ligation  Other Topics Concern   Not on file  Social History Narrative   Divorced since age 35 ,now her sons live with her.       She lives with fiance /Shon since 2016   Social Drivers of Health   Tobacco Use: Low Risk (07/16/2024)   Patient History    Smoking Tobacco Use: Never    Smokeless Tobacco Use: Never    Passive Exposure: Not on file  Financial Resource Strain: Low Risk (07/15/2024)   Overall Financial Resource Strain (CARDIA)    Difficulty of Paying Living Expenses: Not very hard  Food Insecurity: No Food Insecurity (07/15/2024)   Epic    Worried About Programme Researcher, Broadcasting/film/video in the Last Year: Never true    Ran Out of Food in the Last Year: Never true  Transportation Needs: No Transportation Needs (07/15/2024)   Epic    Lack of Transportation (Medical): No    Lack of Transportation (Non-Medical): No  Physical Activity: Sufficiently Active (07/15/2024)   Exercise Vital Sign    Days of Exercise per Week: 4 days    Minutes of Exercise per Session: 40 min  Stress: No Stress Concern Present (07/15/2024)   [employer] of Occupational Health - Occupational Stress Questionnaire    Feeling of Stress: Only a little  Social Connections: Moderately Integrated (07/15/2024)   Social Connection and Isolation Panel    Frequency of Communication with Friends and Family: More than three times a week    Frequency of  Social Gatherings with Friends and Family: Once a week    Attends Religious Services: 1 to 4 times per year    Active Member of [employer] or Organizations: No    Attends Banker Meetings: Not on file    Marital Status: Living with partner  Intimate Partner Violence: Not At Risk (07/16/2024)   Epic    Fear of  Current or Ex-Partner: No    Emotionally Abused: No    Physically Abused: No    Sexually Abused: No  Depression (PHQ2-9): Low Risk (07/16/2024)   Depression (PHQ2-9)    PHQ-2 Score: 0  Alcohol Screen: Low Risk (07/15/2024)   Alcohol Screen    Last Alcohol Screening Score (AUDIT): 1  Housing: Low Risk (07/15/2024)   Epic    Unable to Pay for Housing in the Last Year: No    Number of Times Moved in the Last Year: 0    Homeless in the Last Year: No  Utilities: Not At Risk (07/16/2024)   Epic    Threatened with loss of utilities: No  Health Literacy: Adequate Health Literacy (07/16/2024)   B1300 Health Literacy    Frequency of need for help with medical instructions: Never    Current Medications[1]  Allergies[2]   ROS  Constitutional: Negative for fever or weight change.  Respiratory: Negative for cough and shortness of breath.   Cardiovascular: Negative for chest pain or palpitations.  Gastrointestinal: Negative for abdominal pain, no bowel changes.  Musculoskeletal: Negative for gait problem or joint swelling.  Skin: Negative for rash.  Neurological: Negative for dizziness or headache.  No other specific complaints in a complete review of systems (except as listed in HPI above).   Objective  Vitals:   07/16/24 0857  BP: 126/74  Pulse: 94  Resp: 16  SpO2: 99%  Weight: 212 lb 3.2 oz (96.3 kg)  Height: 5' 3.31 (1.608 m)    Body mass index is 37.22 kg/m.  Physical Exam  Constitutional: Patient appears well-developed and well-nourished. No distress.  HENT: Head: Normocephalic and atraumatic. Ears: B TMs ok, no erythema or effusion; Nose: Nose  normal. Mouth/Throat: Oropharynx is clear and moist. No oropharyngeal exudate.  Eyes: Conjunctivae and EOM are normal. Pupils are equal, round, and reactive to light. No scleral icterus.  Neck: Normal range of motion. Neck supple. No JVD present. No thyromegaly present.  Cardiovascular: Normal rate, regular rhythm and normal heart sounds.  No murmur heard. No BLE edema. Pulmonary/Chest: Effort normal and breath sounds normal. No respiratory distress. Abdominal: Soft. Bowel sounds are normal, no distension. There is no tenderness. no masses Breast: no lumps or masses, no nipple discharge or rashes FEMALE GENITALIA:  Not done  RECTAL: not done  Musculoskeletal: Normal range of motion, no joint effusions. No gross deformities Neurological: he is alert and oriented to person, place, and time. No cranial nerve deficit. Coordination, balance, strength, speech and gait are normal.  Skin: Skin is warm and dry. No rash noted. No erythema.  Psychiatric: Patient has a normal mood and affect. behavior is normal. Judgment and thought content normal.     Assessment & Plan  1. Well woman exam (Primary)  - MM 3D SCREENING MAMMOGRAM BILATERAL BREAST; Future - CBC with Differential/Platelet - Comprehensive metabolic panel with GFR - Lipid panel - Hemoglobin A1c  2. Encounter for screening mammogram for malignant neoplasm of breast  - MM 3D SCREENING MAMMOGRAM BILATERAL BREAST; Future  3. History of iron  deficiency anemia  - CBC with Differential/Platelet  4. Lipid screening  - Lipid panel  5. Diabetes mellitus screening  - Hemoglobin A1c   -USPSTF grade A and B recommendations reviewed with patient; age-appropriate recommendations, preventive care, screening tests, etc discussed and encouraged; healthy living encouraged; see AVS for patient education given to patient -Discussed importance of 150 minutes of physical activity weekly, eat two servings of fish weekly, eat one serving  of tree  nuts ( cashews, pistachios, pecans, almonds.SABRA) every other day, eat 6 servings of fruit/vegetables daily and drink plenty of water and avoid sweet beverages.   -Reviewed Health Maintenance: Yes.      [1]  Current Outpatient Medications:    Iron  18 MG/15ML LIQD, , Disp: , Rfl:    atenolol  (TENORMIN ) 25 MG tablet, Take 1 tablet (25 mg total) by mouth daily. (Patient not taking: Reported on 07/16/2024), Disp: 90 tablet, Rfl: 1   escitalopram  (LEXAPRO ) 10 MG tablet, Take 1 tablet (10 mg total) by mouth daily. Start 1 pill daily and after one week try going up to 2 pills (Patient not taking: Reported on 07/16/2024), Disp: 90 tablet, Rfl: 1   hydrOXYzine  (ATARAX /VISTARIL ) 10 MG tablet, Take 1 tablet (10 mg total) by mouth 3 (three) times daily as needed. (Patient not taking: Reported on 07/16/2024), Disp: 30 tablet, Rfl: 0   norgestimate -ethinyl estradiol  (SPRINTEC 28) 0.25-35 MG-MCG tablet, Take 1 tablet by mouth daily. (Patient not taking: Reported on 07/16/2024), Disp: 84 tablet, Rfl: 3 [2] No Known Allergies

## 2024-07-17 LAB — COMPREHENSIVE METABOLIC PANEL WITH GFR
AG Ratio: 1.6 (calc) (ref 1.0–2.5)
ALT: 10 U/L (ref 6–29)
AST: 15 U/L (ref 10–30)
Albumin: 4.3 g/dL (ref 3.6–5.1)
Alkaline phosphatase (APISO): 105 U/L (ref 31–125)
BUN: 10 mg/dL (ref 7–25)
CO2: 24 mmol/L (ref 20–32)
Calcium: 9.4 mg/dL (ref 8.6–10.2)
Chloride: 105 mmol/L (ref 98–110)
Creat: 0.75 mg/dL (ref 0.50–0.99)
Globulin: 2.7 g/dL (ref 1.9–3.7)
Glucose, Bld: 87 mg/dL (ref 65–99)
Potassium: 4.2 mmol/L (ref 3.5–5.3)
Sodium: 138 mmol/L (ref 135–146)
Total Bilirubin: 0.2 mg/dL (ref 0.2–1.2)
Total Protein: 7 g/dL (ref 6.1–8.1)
eGFR: 103 mL/min/1.73m2 (ref >=60)

## 2024-07-17 LAB — CBC WITH DIFFERENTIAL/PLATELET
Absolute Lymphocytes: 1896 {cells}/uL (ref 850–3900)
Absolute Monocytes: 427 {cells}/uL (ref 200–950)
Basophils Absolute: 36 {cells}/uL (ref 0–200)
Basophils Relative: 0.4 %
Eosinophils Absolute: 134 {cells}/uL (ref 15–500)
Eosinophils Relative: 1.5 %
HCT: 33.3 % — ABNORMAL LOW (ref 35.9–46.0)
Hemoglobin: 10.3 g/dL — ABNORMAL LOW (ref 11.7–15.5)
MCH: 25.8 pg — ABNORMAL LOW (ref 27.0–33.0)
MCHC: 30.9 g/dL — ABNORMAL LOW (ref 31.6–35.4)
MCV: 83.5 fL (ref 81.4–101.7)
MPV: 10.9 fL (ref 7.5–12.5)
Monocytes Relative: 4.8 %
Neutro Abs: 6408 {cells}/uL (ref 1500–7800)
Neutrophils Relative %: 72 %
Platelets: 419 Thousand/uL — ABNORMAL HIGH (ref 140–400)
RBC: 3.99 Million/uL (ref 3.80–5.10)
RDW: 16.3 % — ABNORMAL HIGH (ref 11.0–15.0)
Total Lymphocyte: 21.3 %
WBC: 8.9 Thousand/uL (ref 3.8–10.8)

## 2024-07-17 LAB — IRON,TIBC AND FERRITIN PANEL
%SAT: 5 % — ABNORMAL LOW (ref 16–45)
Ferritin: 9 ng/mL — ABNORMAL LOW (ref 16–154)
Iron: 20 ug/dL — ABNORMAL LOW (ref 40–190)
TIBC: 380 ug/dL (ref 250–450)

## 2024-07-17 LAB — HEMOGLOBIN A1C
Hgb A1c MFr Bld: 5.5 % (ref <5.7)
Mean Plasma Glucose: 111 mg/dL
eAG (mmol/L): 6.2 mmol/L

## 2024-07-17 LAB — LIPID PANEL
Cholesterol: 93 mg/dL (ref <200)
HDL: 52 mg/dL (ref >=50)
LDL Cholesterol (Calc): 28 mg/dL
Non-HDL Cholesterol (Calc): 41 mg/dL (ref <130)
Total CHOL/HDL Ratio: 1.8 (calc) (ref <5.0)
Triglycerides: 51 mg/dL (ref <150)

## 2024-07-19 ENCOUNTER — Ambulatory Visit: Payer: Self-pay | Admitting: Family Medicine

## 2024-07-19 DIAGNOSIS — D500 Iron deficiency anemia secondary to blood loss (chronic): Secondary | ICD-10-CM

## 2024-08-17 ENCOUNTER — Ambulatory Visit
Admission: RE | Admit: 2024-08-17 | Discharge: 2024-08-17 | Disposition: A | Discharge status: Home / Self Care | Source: Ambulatory Visit | Attending: Family Medicine | Admitting: Family Medicine

## 2024-08-17 DIAGNOSIS — Z01419 Encounter for gynecological examination (general) (routine) without abnormal findings: Secondary | ICD-10-CM | POA: Diagnosis present

## 2024-08-17 DIAGNOSIS — Z1231 Encounter for screening mammogram for malignant neoplasm of breast: Secondary | ICD-10-CM | POA: Insufficient documentation

## 2024-08-19 ENCOUNTER — Other Ambulatory Visit: Payer: Self-pay | Admitting: Family Medicine

## 2024-08-19 DIAGNOSIS — R928 Other abnormal and inconclusive findings on diagnostic imaging of breast: Secondary | ICD-10-CM

## 2024-08-23 ENCOUNTER — Ambulatory Visit
Admission: RE | Admit: 2024-08-23 | Discharge: 2024-08-23 | Disposition: A | Source: Ambulatory Visit | Attending: Family Medicine | Admitting: Family Medicine

## 2024-08-23 DIAGNOSIS — R928 Other abnormal and inconclusive findings on diagnostic imaging of breast: Secondary | ICD-10-CM

## 2024-08-24 ENCOUNTER — Other Ambulatory Visit: Payer: Self-pay | Admitting: Family Medicine

## 2024-08-24 DIAGNOSIS — R928 Other abnormal and inconclusive findings on diagnostic imaging of breast: Secondary | ICD-10-CM

## 2024-08-25 ENCOUNTER — Ambulatory Visit: Payer: Self-pay | Admitting: Family Medicine

## 2024-08-26 ENCOUNTER — Ambulatory Visit
Admission: RE | Admit: 2024-08-26 | Discharge: 2024-08-26 | Disposition: A | Discharge status: Home / Self Care | Source: Ambulatory Visit | Attending: Family Medicine | Admitting: Family Medicine

## 2024-08-26 ENCOUNTER — Other Ambulatory Visit: Payer: Self-pay | Admitting: Family Medicine

## 2024-08-26 DIAGNOSIS — R928 Other abnormal and inconclusive findings on diagnostic imaging of breast: Secondary | ICD-10-CM

## 2024-08-26 HISTORY — PX: BREAST BIOPSY: SHX20

## 2024-08-26 MED ORDER — LIDOCAINE-EPINEPHRINE 1 %-1:100000 IJ SOLN
5.0000 mL | Freq: Once | INTRAMUSCULAR | Status: AC
  Administered 2024-08-26: 4 mL
  Filled 2024-08-26: qty 10

## 2024-08-26 MED ORDER — LIDOCAINE 1 % OPTIME INJ - NO CHARGE
2.0000 mL | Freq: Once | INTRAMUSCULAR | Status: AC
  Administered 2024-08-26: 1 mL
  Filled 2024-08-26: qty 2

## 2024-08-26 MED ORDER — LIDOCAINE 1 % OPTIME INJ - NO CHARGE
5.0000 mL | Freq: Once | INTRAMUSCULAR | Status: AC
  Administered 2024-08-26: 5 mL
  Filled 2024-08-26: qty 6

## 2024-08-26 MED ORDER — LIDOCAINE-EPINEPHRINE 1 %-1:100000 IJ SOLN
5.0000 mL | Freq: Once | INTRAMUSCULAR | Status: AC
  Administered 2024-08-26: 3 mL
  Filled 2024-08-26: qty 10

## 2024-08-26 MED ORDER — LIDOCAINE-EPINEPHRINE 1 %-1:100000 IJ SOLN
10.0000 mL | Freq: Once | INTRAMUSCULAR | Status: AC
  Administered 2024-08-26: 10 mL
  Filled 2024-08-26: qty 10

## 2024-08-26 MED ORDER — LIDOCAINE HCL 1 % IJ SOLN
2.0000 mL | Freq: Once | INTRAMUSCULAR | Status: AC
  Administered 2024-08-26: 1 mL
  Filled 2024-08-26: qty 2

## 2024-08-27 LAB — SURGICAL PATHOLOGY

## 2024-08-29 ENCOUNTER — Ambulatory Visit: Payer: Self-pay | Admitting: Family Medicine

## 2025-01-17 ENCOUNTER — Ambulatory Visit: Admitting: Family Medicine

## 2025-07-21 ENCOUNTER — Encounter: Admitting: Family Medicine
